# Patient Record
Sex: Male | Born: 2000 | Race: White | Hispanic: No | Marital: Single | Smoking: Never smoker
Health system: Southern US, Community
[De-identification: ages and names within clinical notes are randomized; demographics above are authoritative.]

## PROBLEM LIST (undated history)

## (undated) DIAGNOSIS — F909 Attention-deficit hyperactivity disorder, unspecified type: Secondary | ICD-10-CM

## (undated) DIAGNOSIS — Z789 Other specified health status: Secondary | ICD-10-CM

---
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
## 2020-02-24 ENCOUNTER — Emergency Department (HOSPITAL_COMMUNITY): Payer: Medicaid Other

## 2020-02-24 ENCOUNTER — Other Ambulatory Visit: Payer: Self-pay

## 2020-02-24 ENCOUNTER — Encounter (HOSPITAL_COMMUNITY): Payer: Self-pay | Admitting: Emergency Medicine

## 2020-02-24 ENCOUNTER — Emergency Department (HOSPITAL_COMMUNITY)
Admission: EM | Admit: 2020-02-24 | Discharge: 2020-02-25 | Disposition: A | Discharge status: Home / Self Care | Payer: Medicaid Other | Attending: Emergency Medicine | Admitting: Emergency Medicine

## 2020-02-24 DIAGNOSIS — R059 Cough, unspecified: Secondary | ICD-10-CM | POA: Diagnosis present

## 2020-02-24 DIAGNOSIS — J069 Acute upper respiratory infection, unspecified: Secondary | ICD-10-CM | POA: Diagnosis not present

## 2020-02-24 DIAGNOSIS — U071 COVID-19: Secondary | ICD-10-CM | POA: Diagnosis not present

## 2020-02-24 DIAGNOSIS — Z20822 Contact with and (suspected) exposure to covid-19: Secondary | ICD-10-CM

## 2020-02-24 NOTE — ED Triage Notes (Signed)
Patient reports generalized body aches with chills , dry cough , headache and sore throat this week , unvaccinated against Covid19 .

## 2020-02-25 LAB — SARS CORONAVIRUS 2 (TAT 6-24 HRS): SARS Coronavirus 2: POSITIVE — AB

## 2020-02-25 NOTE — Discharge Instructions (Signed)
Isolate as if your COVID test is positive until you have received test results.   Return to the ED if you have shortness of breath, high fever, severe pain.

## 2020-02-25 NOTE — ED Provider Notes (Signed)
[facility] EMERGENCY DEPARTMENT Provider Note   CSN: [id number] Arrival date & time: 02/24/20  1919     History Chief Complaint  Patient presents with  . BodyAches/Chills/Cough    Unvaccinated    Alejandro Wood is a 20 y.o. male.  Patient to ED with symptoms of body aches, dry cough, headache and sore throat for the past week. Reports known contact with COVID positive person. He is unvaccinated. No SOB, vomiting, high fever. He is eating and drinking ok. No other health issues.   The history is provided by the patient. No language interpreter was used.       Past Medical History:  Diagnosis Date  . Attention deficit disorder (ADD), child, with hyperactivity   . Medical history non-contributory     Patient Active Problem List   Diagnosis Date Noted  . OCD (obsessive compulsive disorder) 11/13/2018  . Hallucinations 11/12/2018  . Learning disability 10/26/2015  . Groin strain 05/13/2013  . ADD (attention deficit disorder) 06/19/2012    History reviewed. No pertinent surgical history.     Family History  Problem Relation Age of Onset  . Asthma Other   . Cancer Other   . Diabetes Other   . Healthy Mother   . Healthy Father     Social History   Tobacco Use  . Smoking status: Never Smoker  . Smokeless tobacco: Never Used  Vaping Use  . Vaping Use: Every day  Substance Use Topics  . Alcohol use: No  . Drug use: No    Home Medications Prior to Admission medications   Medication Sig Start Date End Date Taking? Authorizing Provider  benztropine (COGENTIN) 0.5 MG tablet Take 1 tablet (0.5 mg total) by mouth 2 (two) times daily. 11/16/18   Leata Mouse, MD  diphenhydrAMINE (BENADRYL) 50 MG capsule Take 1 capsule (50 mg total) by mouth every 8 (eight) hours as needed for sleep (Anxiety and agitation). 11/15/18   Leata Mouse, MD  FLUoxetine (PROZAC) 20 MG capsule Take 1 capsule (20 mg total) by mouth daily. 11/16/18    Leata Mouse, MD  ondansetron (ZOFRAN ODT) 4 MG disintegrating tablet Take 1 tablet (4 mg total) by mouth every 8 (eight) hours as needed for nausea or vomiting. 08/29/19   Lamptey, Britta Mccreedy, MD  risperiDONE (RISPERDAL) 1 MG tablet Take 1 tablet (1 mg total) by mouth 2 (two) times daily. 11/16/18   Leata Mouse, MD  risperiDONE (RISPERDAL) 2 MG tablet Take by mouth. 07/13/19   [provider]  traZODone (DESYREL) 50 MG tablet TAKE 1 TABLET BY ORAL ROUTE PER AT BEDTIME 2ND LINE OPTION TO TAKE AS NEEDED FOR SLEEP 07/13/19   [provider]    Allergies    Patient has no known allergies.  Review of Systems   Review of Systems  Constitutional: Negative for chills and fever.  HENT: Positive for congestion.   Respiratory: Positive for cough. Negative for shortness of breath.   Cardiovascular: Negative.  Negative for chest pain.  Gastrointestinal: Negative.  Negative for nausea.  Genitourinary: Negative.   Musculoskeletal: Positive for myalgias.  Skin: Negative.   Neurological: Positive for headaches. Negative for syncope and weakness.    Physical Exam Updated Vital Signs BP 114/70 (BP Location: Right Arm)   Pulse 66   Temp 98.3 F (36.8 C) (Oral)   Resp 18   Ht 5\' 9"  (1.753 m)   Wt 62 kg   SpO2 99%   BMI 20.18 kg/m   Physical  Exam Vitals and nursing note reviewed.  Constitutional:      Appearance: He is well-developed and well-nourished.  HENT:     Head: Normocephalic.     Nose: Nose normal.     Mouth/Throat:     Mouth: Mucous membranes are moist.  Cardiovascular:     Rate and Rhythm: Normal rate and regular rhythm.     Heart sounds: No murmur heard.   Pulmonary:     Effort: Pulmonary effort is normal.     Breath sounds: Normal breath sounds. No wheezing, rhonchi or rales.  Abdominal:     General: Bowel sounds are normal.     Palpations: Abdomen is soft.     Tenderness: There is no abdominal tenderness. There is no guarding or  rebound.  Musculoskeletal:        General: Normal range of motion.     Cervical back: Normal range of motion and neck supple.  Skin:    General: Skin is warm and dry.     Findings: No rash.  Neurological:     Mental Status: He is alert and oriented to person, place, and time.  Psychiatric:        Mood and Affect: Mood and affect normal.     ED Results / Procedures / Treatments   Labs (all labs ordered are listed, but only abnormal results are displayed) Labs Reviewed  SARS CORONAVIRUS 2 (TAT 6-24 HRS)    EKG None  Radiology DG Chest Portable 1 View  Result Date: 02/24/2020 CLINICAL DATA:  Cough, chills, body aches EXAM: PORTABLE CHEST 1 VIEW COMPARISON:  None. FINDINGS: The heart size and mediastinal contours are within normal limits. Both lungs are clear. The visualized skeletal structures are unremarkable. IMPRESSION: Negative. Electronically Signed   By: Charlett Nose M.D.   On: 02/24/2020 21:52    Procedures Procedures (including critical care time)  Medications Ordered in ED Medications - No data to display  ED Course  I have reviewed the triage vital signs and the nursing notes.  Pertinent labs & imaging results that were available during my care of the patient were reviewed by me and considered in my medical decision making (see chart for details).    MDM Rules/Calculators/A&P                          Patient to ED with ss/sxs as per HPI.   He is very well appearing. VSS, no fever, hypoxia or tachycardia. COVID is pending but presumed positive with current symptoms and known direct contact. CXR clear.   He can go home with instructions on isolation. He reports he is familiar with MYchart to access test results. Return precautions discussed.   Final Clinical Impression(s) / ED Diagnoses Final diagnoses:  None   1. Viral URI 2. COVID exposure.  Rx / DC Orders ED Discharge Orders    None       Elpidio Anis, Cordelia Poche 02/25/20 0041    Dione Booze,  MD 02/25/20 [phone]

## 2020-03-28 ENCOUNTER — Encounter (HOSPITAL_COMMUNITY): Payer: Self-pay

## 2020-03-28 ENCOUNTER — Emergency Department (HOSPITAL_COMMUNITY)
Admission: EM | Admit: 2020-03-28 | Discharge: 2020-03-30 | Disposition: A | Discharge status: Home / Self Care | Payer: Medicaid Other | Attending: Emergency Medicine | Admitting: Emergency Medicine

## 2020-03-28 DIAGNOSIS — Z046 Encounter for general psychiatric examination, requested by authority: Secondary | ICD-10-CM | POA: Insufficient documentation

## 2020-03-28 DIAGNOSIS — F429 Obsessive-compulsive disorder, unspecified: Secondary | ICD-10-CM | POA: Insufficient documentation

## 2020-03-28 DIAGNOSIS — R440 Auditory hallucinations: Secondary | ICD-10-CM | POA: Diagnosis not present

## 2020-03-28 DIAGNOSIS — F909 Attention-deficit hyperactivity disorder, unspecified type: Secondary | ICD-10-CM | POA: Insufficient documentation

## 2020-03-28 DIAGNOSIS — F32A Depression, unspecified: Secondary | ICD-10-CM | POA: Diagnosis present

## 2020-03-28 DIAGNOSIS — Z79899 Other long term (current) drug therapy: Secondary | ICD-10-CM | POA: Insufficient documentation

## 2020-03-28 DIAGNOSIS — R45851 Suicidal ideations: Secondary | ICD-10-CM | POA: Insufficient documentation

## 2020-03-28 DIAGNOSIS — U071 COVID-19: Secondary | ICD-10-CM | POA: Diagnosis not present

## 2020-03-28 DIAGNOSIS — F840 Autistic disorder: Secondary | ICD-10-CM | POA: Diagnosis present

## 2020-03-28 DIAGNOSIS — F6381 Intermittent explosive disorder: Secondary | ICD-10-CM | POA: Diagnosis present

## 2020-03-28 LAB — CBC
HCT: 46 % (ref 39.0–52.0)
Hemoglobin: 16.2 g/dL (ref 13.0–17.0)
MCH: 31.8 pg (ref 26.0–34.0)
MCHC: 35.2 g/dL (ref 30.0–36.0)
MCV: 90.2 fL (ref 80.0–100.0)
Platelets: 240 10*3/uL (ref 150–400)
RBC: 5.1 MIL/uL (ref 4.22–5.81)
RDW: 12.3 % (ref 11.5–15.5)
WBC: 8.2 10*3/uL (ref 4.0–10.5)
nRBC: 0 % (ref 0.0–0.2)

## 2020-03-28 LAB — COMPREHENSIVE METABOLIC PANEL
ALT: 17 U/L (ref 0–44)
AST: 21 U/L (ref 15–41)
Albumin: 4.8 g/dL (ref 3.5–5.0)
Alkaline Phosphatase: 66 U/L (ref 38–126)
Anion gap: 12 (ref 5–15)
BUN: 6 mg/dL (ref 6–20)
CO2: 24 mmol/L (ref 22–32)
Calcium: 9.4 mg/dL (ref 8.9–10.3)
Chloride: 102 mmol/L (ref 98–111)
Creatinine, Ser: 1.08 mg/dL (ref 0.61–1.24)
GFR, Estimated: >60 mL/min (ref >60)
Glucose, Bld: 88 mg/dL (ref 70–99)
Potassium: 3.4 mmol/L — ABNORMAL LOW (ref 3.5–5.1)
Sodium: 138 mmol/L (ref 135–145)
Total Bilirubin: 1.3 mg/dL — ABNORMAL HIGH (ref 0.3–1.2)
Total Protein: 7 g/dL (ref 6.5–8.1)

## 2020-03-28 LAB — RAPID URINE DRUG SCREEN, HOSP PERFORMED
Amphetamines: NOT DETECTED
Barbiturates: NOT DETECTED
Benzodiazepines: NOT DETECTED
Cocaine: NOT DETECTED
Opiates: NOT DETECTED
Tetrahydrocannabinol: NOT DETECTED

## 2020-03-28 NOTE — ED Provider Notes (Signed)
[facility] EMERGENCY DEPARTMENT Provider Note   CSN: [id number] Arrival date & time: 03/28/20  2232     History Chief Complaint  Patient presents with  . IVC    Alejandro Wood is a 20 y.o. male presenting under IVC for auditory and visual hallucinations and suicidal thoughts.  Level 5 caveat due to psychiatric disorder.  Per patient, he is not sick and he does not need to be here.  He denies SI, HI, AVH.  He reports no medical problems, states he takes no medications daily.  He states he has never been on medicines.  He denies holding a knife to himself at any point.  Additional history obtained from [employer].  They state that mom reported patient has been saying he wants to kill himself.  He recently had a relationship and.  Per [employer], mom states patient had a knife yesterday that they were able to take away from him, however he was reporting to cut himself.  Per IVC paperwork, patient is having visual hallucinations.  Additionally, he has threatened to kill his mom if she commits him.  Per [employer], mom reports patient has been on medication before, but he does not take it because it makes him "dumb."  Per mom, patient has a history of schizophrenia.  Additional history obtained from chart review.  Per chart review, patient has previously been prescribed medication including Risperdal, Prozac, Cogentin. Last visit in our system was 10/2018.   HPI     Past Medical History:  Diagnosis Date  . Attention deficit disorder (ADD), child, with hyperactivity   . Medical history non-contributory     Patient Active Problem List   Diagnosis Date Noted  . OCD (obsessive compulsive disorder) 11/13/2018  . Hallucinations 11/12/2018  . Learning disability 10/26/2015  . Groin strain 05/13/2013  . ADD (attention deficit disorder) 06/19/2012    History reviewed. No pertinent surgical history.     Family History  Problem Relation Age of Onset  . Asthma Other   . Cancer Other    . Diabetes Other   . Healthy Mother   . Healthy Father     Social History   Tobacco Use  . Smoking status: Never Smoker  . Smokeless tobacco: Never Used  Vaping Use  . Vaping Use: Every day  Substance Use Topics  . Alcohol use: No  . Drug use: No    Home Medications Prior to Admission medications   Medication Sig Start Date End Date Taking? Authorizing Provider  benztropine (COGENTIN) 0.5 MG tablet Take 1 tablet (0.5 mg total) by mouth 2 (two) times daily. 11/16/18   Leata Mouse, MD  diphenhydrAMINE (BENADRYL) 50 MG capsule Take 1 capsule (50 mg total) by mouth every 8 (eight) hours as needed for sleep (Anxiety and agitation). 11/15/18   Leata Mouse, MD  FLUoxetine (PROZAC) 20 MG capsule Take 1 capsule (20 mg total) by mouth daily. 11/16/18   Leata Mouse, MD  ondansetron (ZOFRAN ODT) 4 MG disintegrating tablet Take 1 tablet (4 mg total) by mouth every 8 (eight) hours as needed for nausea or vomiting. 08/29/19   Lamptey, Britta Mccreedy, MD  risperiDONE (RISPERDAL) 1 MG tablet Take 1 tablet (1 mg total) by mouth 2 (two) times daily. 11/16/18   Leata Mouse, MD  risperiDONE (RISPERDAL) 2 MG tablet Take by mouth. 07/13/19   [provider]  traZODone (DESYREL) 50 MG tablet TAKE 1 TABLET BY ORAL ROUTE PER AT BEDTIME 2ND LINE OPTION TO TAKE AS  NEEDED FOR SLEEP 07/13/19   [provider]    Allergies    Patient has no known allergies.  Review of Systems   Review of Systems  Unable to perform ROS: Psychiatric disorder    Physical Exam Updated Vital Signs BP 123/75 (BP Location: Right Arm)   Pulse 89   Temp 98.7 F (37.1 C) (Oral)   Resp 16   SpO2 98%   Physical Exam Vitals and nursing note reviewed.  Constitutional:      General: He is not in acute distress.    Appearance: He is well-developed and well-nourished.     Comments: Appears nontoxic  HENT:     Head: Normocephalic and atraumatic.  Eyes:     Extraocular  Movements: EOM normal.  Pulmonary:     Effort: Pulmonary effort is normal.  Abdominal:     General: There is no distension.  Musculoskeletal:        General: Normal range of motion.     Cervical back: Normal range of motion.  Skin:    General: Skin is warm.     Findings: No rash.  Neurological:     Mental Status: He is alert and oriented to person, place, and time.  Psychiatric:        Mood and Affect: Mood and affect normal. Affect is flat.        Behavior: Behavior is withdrawn.     Comments: Flat affect.  Withdrawn behavior. Poor eye contact.  Denying SI, HI, AVH, however per paperwork, patient has been reporting that he wanted to kill himself, held a knife up to himself, and has threatened to kill his mom.  Additionally, per mom patient has been having hallucinations.     ED Results / Procedures / Treatments   Labs (all labs ordered are listed, but only abnormal results are displayed) Labs Reviewed  RESP PANEL BY RT-PCR (FLU A&B, COVID) ARPGX2  COMPREHENSIVE METABOLIC PANEL  ETHANOL  SALICYLATE LEVEL  ACETAMINOPHEN LEVEL  CBC  RAPID URINE DRUG SCREEN, HOSP PERFORMED    EKG None  Radiology No results found.  Procedures Procedures   Medications Ordered in ED Medications - No data to display  ED Course  I have reviewed the triage vital signs and the nursing notes.  Pertinent labs & imaging results that were available during my care of the patient were reviewed by me and considered in my medical decision making (see chart for details).    MDM Rules/Calculators/A&P                          Patient presenting for psychiatric evaluation.  On exam, patient appears nontoxic.  He has flat affect with withdrawn behavior.  Patient denying SI, HI, AVH, however per mom/[employer]/IVC paperwork, patient has had suicidal statements and behaviors in the past few days.  He is not currently on any medication.  First exam completed by myself.  Patient is medically cleared for  psychiatric evaluation at this time.  The patient has been placed in psychiatric observation due to the need to provide a safe environment for the patient while obtaining psychiatric consultation and evaluation, as well as ongoing medical and medication management to treat the patient's condition.  The patient has been placed under full IVC at this time.  Final Clinical Impression(s) / ED Diagnoses Final diagnoses:  None    Rx / DC Orders ED Discharge Orders    None  Alveria Apley, PA-C 03/28/20 2323    Sabas Sous, MD 03/29/20 [phone]

## 2020-03-28 NOTE — ED Triage Notes (Signed)
Pt comes via [employer] with IVC paperwork, according to paperwork pt had a knife and stated he wanted to kill himself, pt denies SI/HI/AVH

## 2020-03-28 NOTE — BH Assessment (Incomplete)
Mother reports that patient did grab a knife last night.  He says "I want to die, I don't have anything to live for."  Patient will tell family that he wants to walk the streets.  Mother says that he last night was saying "get out of my head, I can't stand this."  She said that he is supposed to be on medications but he will not take it.  He says the medication makes things worse.  Mother said that he has been on respiridone.    Mother said that patient has autism but is high functioning.  He went through high school and got his regular diploma. Patient does not have a guardian.

## 2020-03-29 DIAGNOSIS — R45851 Suicidal ideations: Secondary | ICD-10-CM

## 2020-03-29 DIAGNOSIS — F840 Autistic disorder: Secondary | ICD-10-CM | POA: Diagnosis present

## 2020-03-29 DIAGNOSIS — F32A Depression, unspecified: Secondary | ICD-10-CM | POA: Diagnosis present

## 2020-03-29 DIAGNOSIS — F6381 Intermittent explosive disorder: Secondary | ICD-10-CM | POA: Diagnosis present

## 2020-03-29 LAB — RESP PANEL BY RT-PCR (FLU A&B, COVID) ARPGX2
Influenza A by PCR: NEGATIVE
Influenza B by PCR: NEGATIVE
SARS Coronavirus 2 by RT PCR: POSITIVE — AB

## 2020-03-29 LAB — ETHANOL: Alcohol, Ethyl (B): <10 mg/dL (ref <10)

## 2020-03-29 LAB — ACETAMINOPHEN LEVEL: Acetaminophen (Tylenol), Serum: <10 ug/mL — ABNORMAL LOW (ref 10–30)

## 2020-03-29 LAB — SALICYLATE LEVEL: Salicylate Lvl: <7 mg/dL — ABNORMAL LOW (ref 7.0–30.0)

## 2020-03-29 MED ORDER — ZIPRASIDONE MESYLATE 20 MG IM SOLR: 10.0000 mg | INTRAMUSCULAR | Status: DC | PRN

## 2020-03-29 MED ORDER — RISPERIDONE 1 MG PO TBDP
1.0000 mg | ORAL_TABLET | Freq: Every day | ORAL | Status: DC
  Administered 2020-03-30: 1 mg via ORAL
  Filled 2020-03-29: qty 1

## 2020-03-29 MED ORDER — BENZTROPINE MESYLATE 1 MG PO TABS
0.5000 mg | ORAL_TABLET | Freq: Two times a day (BID) | ORAL | Status: DC
  Administered 2020-03-29: 0.5 mg via ORAL
  Filled 2020-03-29: qty 1

## 2020-03-29 MED ORDER — RISPERIDONE 2 MG PO TABS
2.0000 mg | ORAL_TABLET | Freq: Every day | ORAL | Status: DC
  Administered 2020-03-29: 2 mg via ORAL
  Filled 2020-03-29 (×2): qty 1

## 2020-03-29 MED ORDER — DIPHENHYDRAMINE HCL 25 MG PO CAPS: 50.0000 mg | ORAL_CAPSULE | Freq: Three times a day (TID) | ORAL | Status: DC | PRN

## 2020-03-29 MED ORDER — FLUOXETINE HCL 20 MG PO CAPS
20.0000 mg | ORAL_CAPSULE | Freq: Every day | ORAL | Status: DC
  Administered 2020-03-29 – 2020-03-30 (×2): 20 mg via ORAL
  Filled 2020-03-29 (×2): qty 1

## 2020-03-29 NOTE — BH Assessment (Signed)
Comprehensive Clinical Assessment (CCA) Note  03/29/2020 Alejandro Wood [id number] -Clinician reviewed note from Alveria Apley, PA.  Alejandro Wood is a 20 y.o. male presenting under IVC for auditory and visual hallucinations and suicidal thoughts. Per patient, he is not sick and he does not need to be here.  He denies SI, HI, AVH.  He reports no medical problems, states he takes no medications daily.  He states he has never been on medicines.  He denies holding a knife to himself at any point. Additional history obtained from [employer].  They state that mom reported patient has been saying he wants to kill himself.  He recently had a relationship and.  Per [employer], mom states patient had a knife yesterday that they were able to take away from him, however he was reporting to cut himself.  Per IVC paperwork, patient is having visual hallucinations.  Additionally, he has threatened to kill his mom if she commits him.  Per [employer], mom reports patient has been on medication before, but he does not take it because it makes him "dumb."  Per mom, patient has a history of schizophrenia.  Patient says he does not know why he is at the hospital.  Clinician asked if he felt suicidal or if he had taken a knife yesterday (02/11) to harm himself.  He says "No, I don't know what you are talking about."  Pt denies any threats to harm his mother if she had him committed.  He says "I don't know what you are talking about."    Patient denies any A/V hallucinations.  Denies use of ETOH or A/V hallucinations.  He responds "no" to questions about depression or having recently lost a relationship.  Patient has fair eye contact and is restless.  He taps his foot and appears impatient with assessment.  He does not appear to be responding to internal stimuli at this time.  When asked about previous hospitalization he says "I was depressed but I am better now.  I don't need anything."  Pt does not appear to be having delusional thought  processes.  He reports appetite and sleep to be WNL.  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   Mother reports that patient did grab a knife last night.  He says "I want to die, I don't have anything to live for."  Patient will tell family that he wants to walk the streets.  Mother says that he last night was saying "get out of my head, I can't stand this."  She said that he is supposed to be on medications but he will not take it.  He says the medication makes things worse.  Mother said that he has been on respiridone.    Mother said that patient has autism but is high functioning.  He went through high school and got his regular diploma. Patient does not have a guardian.   Mother is concerned about transportation if patient were to go away to another hospital.  -Clinician spoke with Melbourne Abts, PA who recommends patient be observed in the ED overnight for safety.  Clinician IM'ed Alejandro Honour, RN of disposition.  Chief Complaint:  Chief Complaint  Patient presents with  . IVC   Visit Diagnosis: ADD, OCD   CCA Screening, Triage and Referral (STR)  Patient Reported Information How did you hear about [place]? Legal System (Pt is on IVC.)  Referral name: No data recorded Referral phone number: No data recorded  Whom do you see for routine medical problems? Primary Care  Practice/Facility Name: [facility] medicine  Practice/Facility Phone Number: No data recorded Name of Contact: Dr. Lynnae Sandhoff Number: No data recorded Contact Fax Number: No data recorded Prescriber Name: No data recorded Prescriber Address (if known): No data recorded  What Is the Reason for Your Visit/Call Today? Patient is on IVC.  Petitioner said in IVC that patient has had a relationship to end recently.  Yesterday they had to take a knife away from him.  Today, according to IVC, he has threatened to kill mother if she committed him.  According to IVC, patient has threatened  to cut himself.  Patient says that this did not happen, "I don't know what you are talking about."  He says this often as a response to questions.  Pt denies any SI, HI or A/V hallucinations.  How Long Has This Been Causing You Problems? <Week  What Do You Feel Would Help You the Most Today? Other (Comment) (Pt does not know why he is at the hospital.)   Have You Recently Been in Any Inpatient Treatment (Hospital/Detox/Crisis Center/28-Day Program)? No  Name/Location of Program/Hospital:No data recorded How Long Were You There? No data recorded When Were You Discharged? No data recorded  Have You Ever Received Services From [facility] Before? Yes  Who Do You See at [facility]? Pt was on the [facility] C/A unit in 10/2018   Have You Recently Had Any Thoughts About Hurting Yourself? No (Pt says he did not want to hurt himself.  IVC papers say he had threatened to to harm himself.)  Are You Planning to Commit Suicide/Harm Yourself At This time? No (Pt denies SI.)   Have you Recently Had Thoughts About Hurting Someone Alejandro Wood? No (Reportedly has threatened to kill mother if she committed him.)  Explanation: No data recorded  Have You Used Any Alcohol or Drugs in the Past 24 Hours? No  How Long Ago Did You Use Drugs or Alcohol? No data recorded What Did You Use and How Much? No data recorded  Do You Currently Have a Therapist/Psychiatrist? Yes  Name of Therapist/Psychiatrist: Dr. Criss Alvine with [facility] does psychiatric meds.   Have You Been Recently Discharged From Any Office Practice or Programs? No  Explanation of Discharge From Practice/Program: No data recorded    CCA Screening Triage Referral Assessment Type of Contact: Tele-Assessment  Is this Initial or Reassessment? Initial Assessment  Date Telepsych consult ordered in [facility]:  03/28/2020  Time Telepsych consult ordered in [facility]:  2319   Patient Reported Information Reviewed? Yes  Patient Left Without Being Seen? No data  recorded Reason for Not Completing Assessment: No data recorded  Collateral Involvement: Jesua Tamblyn (mother) [phone]   Does Patient Have a Court Appointed Legal Guardian? No data recorded Name and Contact of Legal Guardian: No data recorded If Minor and Not Living with Parent(s), Who has Custody? No data recorded Is CPS involved or ever been involved? Never  Is APS involved or ever been involved? Never   Patient Determined To Be At Risk for Harm To Self or Others Based on Review of Patient Reported Information or Presenting Complaint? No  Method: No data recorded Availability of Means: No data recorded Intent: No data recorded Notification Required: No data recorded Additional Information for Danger to Others Potential: No data recorded Additional Comments for Danger to Others Potential: No data recorded Are There Guns or Other Weapons in Your Home? No data recorded  Types of Guns/Weapons: No data recorded Are These Weapons Safely Secured?                            No data recorded Who Could Verify You Are Able To Have These Secured: No data recorded Do You Have any Outstanding Charges, Pending Court Dates, Parole/Probation? No data recorded Contacted To Inform of Risk of Harm To Self or Others: Other: Comment (Mother is aware of threat to her safety.)   Location of Assessment: [facility] ED   Does Patient Present under Involuntary Commitment? Yes  IVC Papers Initial File Date: 03/28/2020   [place] of Residence: [place]   Patient Currently Receiving the Following Services: Medication Management   Determination of Need: Emergent (2 hours)   Options For Referral: Therapeutic Triage Services (Pt is to be observed overnight and be seen by psychiatry in AM.)     CCA Biopsychosocial Intake/Chief Complaint:  Patient arrived at [facility] on IVC initiated by family member.  According to IVC papers patient has recently been in an relationship that ended.  He has made threats  to kill himself and yesterday a knife had to be taken from him.  Patient reportedly had threatened to kill his mother if she took out papers on him.  Patint reportedly has been cutting.  Patient denies all of this.  He denies SI, HI or A/V hallucinations.  He responds "I don't know what you are talking about" often.  Current Symptoms/Problems: Pt is denying any SI, HI or A/V hallucinations.  Mother says that patient however will say "get out of my head, I can't stand this."  as if talking to someone not there.  She said he has left the house and wandered teh streets and she said this is dangerous in their neighborhood.  Mother said that patient has medication that he is supposed to be taking but he refused to take it.  It is prescribed by a Dr. Criss Alvine with [facility] she said.   Patient Reported Schizophrenia/Schizoaffective Diagnosis in Past: -- (Per mother he has "borderline schizophrenia.")   Strengths: No data recorded Preferences: No data recorded Abilities: No data recorded  Type of Services Patient Feels are Needed: No data recorded  Initial Clinical Notes/Concerns: No data recorded  Mental Health Symptoms Depression:  None (Patient denies.)   Duration of Depressive symptoms: No data recorded  Mania:  None (Pt denies.)   Anxiety:   None (Pt denies.)   Psychosis:  Hallucinations (Pt denies but IVC says otherwise.)   Duration of Psychotic symptoms: No data recorded  Trauma:  None   Obsessions:  None   Compulsions:  None   Inattention:  None   Hyperactivity/Impulsivity:  N/A   Oppositional/Defiant Behaviors:  Argumentative (Per mother.)   Emotional Irregularity:  No data recorded  Other Mood/Personality Symptoms:  No data recorded   Mental Status Exam Appearance and self-care  Stature:  Average   Weight:  Average weight   Clothing:  No data recorded  Grooming:  Well-groomed   Cosmetic use:  No data recorded  Posture/gait:  No data recorded  Motor activity:   Agitated; Restless   Sensorium  Attention:  Vigilant   Concentration:  Normal   Orientation:  Time   Recall/memory:  Defective in Recent   Affect and Mood  Affect:  Congruent   Mood:  Anxious   Relating  Eye contact:  Normal   Facial expression:  Tense   Attitude toward examiner:  Defensive   Thought and Language  Speech flow: Clear and Coherent   Thought content:  Appropriate to Mood and Circumstances   Preoccupation:  None   Hallucinations:  -- (Mother said he is responding to internal stimuli.)   Organization:  No data recorded  Aff[employer] Computer ServicesExecutive Functions  Fund of Knowledge:  No data recorded  Intelligence:  No data recorded  Abstraction:  No data recorded  Judgement:  No data recorded  Reality Testing:  No data recorded  Insight:  No data recorded  Decision Making:  No data recorded  Social Functioning  Social Maturity:  Impulsive   Social Judgement:  Naive   Stress  Stressors:  No data recorded  Coping Ability:  No data recorded  Skill Deficits:  Decision making   Supports:  Family     Religion:    Leisure/Recreation:    Exercise/Diet: Exercise/Diet Do You Follow a Special Diet?: No Do You Have Any Trouble Sleeping?: No   CCA Employment/Education Employment/Work Situation: Employment / Work Situation Employment situation: Employed 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 This is the only job he has had. He states he has worked with his parents for a long time. 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 Has patient ever been in the [employer] and Companymilitary?: No  Education: Education Did Garment/textile technologistYou Graduate From [employer] School?: Yes Did Theme park managerYou Attend College?: No   CCA Family/Childhood History Family and Relationship History: Family history Marital  status: Single  Childhood History:  Childhood History Does patient have siblings?: Yes Number of Siblings: 4 Did patient suffer any verbal/emotional/physical/sexual abuse as a child?: No Did patient suffer from severe childhood neglect?: No Has patient ever been sexually abused/assaulted/raped as an adolescent or adult?: No Was the patient ever a victim of a crime or a disaster?: No Witnessed domestic violence?: No Has patient been affected by domestic violence as an adult?: No  Child/Adolescent Assessment:     CCA Substance Use Alcohol/Drug Use: Alcohol / Drug Use Pain Medications: None Prescriptions: Pt is suposed to be taking certraline HCL, Trazadone, respiridone, beztropine. Over the Counter: Viatramins History of alcohol / drug use?: No history of alcohol / drug abuse                         ASAM's:  Six Dimensions of Multidimensional Assessment  Dimension 1:  Acute Intoxication and/or Withdrawal Potential:      Dimension 2:  Biomedical Conditions and Complications:      Dimension 3:  Emotional, Behavioral, or Cognitive Conditions and Complications:     Dimension 4:  Readiness to Change:     Dimension 5:  Relapse, Continued use, or Continued Problem Potential:     Dimension 6:  Recovery/Living Environment:     ASAM Severity Score:    ASAM Recommended Level of Treatment:     Substance use Disorder (SUD)    Recommendations for Services/Supports/Treatments:    DSM5 Diagnoses: Patient Active Problem List   Diagnosis Date Noted  . OCD (obsessive compulsive disorder) 11/13/2018  . Hallucinations 11/12/2018  . Learning disability 10/26/2015  . Groin strain 05/13/2013  . ADD (attention deficit disorder) 06/19/2012    Patient Centered Plan: Patient is on the following Treatment Plan(s):  Anxiety   Referrals to Alternative Service(s): Referred  to Alternative Service(s):   Place:   Date:   Time:    Referred to Alternative Service(s):   Place:   Date:    Time:    Referred to Alternative Service(s):   Place:   Date:   Time:    Referred to Alternative Service(s):   Place:   Date:   Time:     Wandra Mannan

## 2020-03-29 NOTE — Consult Note (Signed)
  Provider attempted to complete assessment. Machine continues not to work properly. Charge RN [employer] notified. Will re-attempt shortly.

## 2020-03-29 NOTE — ED Provider Notes (Signed)
Emergency Medicine Observation Re-evaluation Note  Alejandro Wood is a 20 y.o. male, seen on rounds today.  Pt initially presented to the ED for complaints of IVC Currently, the patient is anxious.  Physical Exam  BP 116/64 (BP Location: Right Arm)   Pulse 87   Temp 98.6 F (37 C) (Oral)   Resp 14   SpO2 97%  Physical Exam General: no acute distress Lungs: Normal work of breathing Neuro: Ambulatory without difficulty Psych: Anxious  ED Course / MDM  EKG:    I have reviewed the labs performed to date as well as medications administered while in observation.  Recent changes in the last 24 hours include intermittent anxiety, agitation.  Plan  Current plan is for TTS assessment  Called by nursing to bedside as patient was becoming anxious and aggitated.  Patient is currently under IVC.  My evaluation patient does appear anxious. He however is redirectable.  Does not want any medications at this time. Will write for some PRN meds to help with anxiety should patient need.  Patient does not answer any other questions  Patient is under full IVC at this time.   Reaghan Kawa A, PA-C 03/29/20 1309    Jacalyn Lefevre, MD 03/29/20 1329

## 2020-03-29 NOTE — BH Assessment (Addendum)
PROVIDER TO SEE-Upon review of chart, provider attempted to complete assessment. Machine continues not to work properly. Charge RN [employer] notified. Will re-attempt shortly. Disposition remains pending the provider's assessment.

## 2020-03-29 NOTE — ED Notes (Signed)
Patient given phone call at desk. Patient restless and agitated; stating he does not need to be here, requesting that his belongings be given back to him. This RN explained protocol and why belongings cannot be returned at this time. Britni PA to bedside to assess patient. Patient states he is agitated and irritable but does not want to take any medications. PRN orders to be placed by provider. RN to alert security and administer meds if behaviors escalate.

## 2020-03-29 NOTE — ED Notes (Signed)
TTS in progress 

## 2020-03-29 NOTE — ED Notes (Signed)
Patients mother updated via telephone.  °

## 2020-03-29 NOTE — ED Notes (Signed)
Staffing contacted to determine when sitter would be available for patient. No sitters available at this time. RN informed by staffing that sitter may also not be available for following shift.

## 2020-03-29 NOTE — ED Notes (Signed)
One bag of pt items, put in locker number 1

## 2020-03-29 NOTE — ED Notes (Signed)
Breakfast Ordered 

## 2020-03-29 NOTE — ED Notes (Signed)
Lynelle Doctor, MD spoke with pt to update him on the IVC process and informed pt we are awaiting the TTS evaluation to be documented.

## 2020-03-29 NOTE — ED Notes (Signed)
TTS eval completed.

## 2020-03-29 NOTE — ED Notes (Signed)
Food and drink provided.

## 2020-03-29 NOTE — ED Notes (Signed)
Updated pt on plan of care following results of TTS eval. Pt agrees to take recommended medications. Plan is to discharge pt with family tomorrow morning per Balinda Quails, NP.

## 2020-03-29 NOTE — Consult Note (Signed)
  TTS machine unavailable at this time. Will attempt at a later time.

## 2020-03-29 NOTE — ED Notes (Signed)
Patient's mother updated by Missy RN

## 2020-03-29 NOTE — Consult Note (Signed)
Telepsych Consultation   Reason for Consult:  Psych consult Referring Physician:  Alveria ApleySophia Caccavale, PA-C Location of Patient: [facility] [phone] [facility] Provider[facility]  Patient Identification: Alejandro Wood MRN:  [id number] Principal Diagnosis: Suicidal ideations Diagnosis:  Principal Problem:   Suicidal ideations Active Problems:   Autism spectrum   Intermittent explosive disorder in adult   Depression  Total Time spent with patient: 15 minutes  Subjective:   Alejandro Wood is a 20 y.o. male patient admitted via IVC for auditory and visual hallucinations and suicidal thoughts. Patient has history of Autism Spectrum Disorder. Patient initially presents calm and cooperative. Patient is alert and oriented. When asked reason for being in hospital patient responded, "Umm my mom, I guess. I don't know. I don't know why I'm here honestly. She just thinks I'm going to harm myself or someone else. Honestly if I was going to harm myself or someone else I would have done it already".   Patient admits to making statements about wanting to take my life, states he's unable to state the exact date (about 2 days ago). Says he said it because "I didn't want to live no more I guess. Just tired of life. Sometimes I just feel loved. The people I love that I've lost, grandma, grandpa, I guess. 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 Patient endorses having a knife and states he did have intent on harming himself but changed his mind "because nothing is worth taking your life no matter what it is. If I was going to take my life I would've done it already, I've had plenty of chances". Patient admits threatening his mother stating, "I know she's my mom but I don't like it. She's like a bug. She just nags and nags, it's like she gets a kick out of. She just doesn't understand I want to be left alone sometimes like  she does. I have the thoughts of wanting hurt her but I would never hurt her.   Endorsing "good" sleep, "amazing" energy, denies any feelings of hopelessness/worthlessness. Patient states he "doesn't like to do much"; no girlfriend or significant relationship. States he "loves to go to work come home play the game, and study hard for what I want to do in life". Grandmother passed 01/2020, states they were very close and spoke everyday. Patient endorses extensive history of explosive behavior "all the time. All my life". Patient states he did take medication "a long time ago, but it doesn't help me". Patient denies any alcohol or illicit substance use. States he doesn't understand why he's here, "I don't understand. I'm [age] almost 20 years old, I'm a grown man. I shouldn't have to be somewhere I don't want to be. I don't want no help, I don't need no help. Just let me go home and go to work and let me deal with that".   Patient denies any thoughts of harm himself or others, auditory or visual hallucinations; does appear to be possibly responding to some external/internal stimuli. Patient provided name and number of his mother for collateral information. Gave permission to speak to his mother. At the end of assessment patient began to escalate demanding discharge stating, "I'm not staying here another night. I'm leaving. I hate this place. Y'all are just going to have to do what you have to do".   Collateral: [facility] Aranabitha Wood [phone] "He's borderline schizophrenia, depression, and anxiety. Back  in September 2020, he spent 7 days at [facility] and was sent to [facility]. He has depression and anxiety; and when it builds up he can't handle it. He talks to himself. He paces the floor. I'm not sure if it's voices, I have a sister with schizophrenia that does the same thing. He creates these scenarios in his head and then creates these challenges that he says he has to complete. He was on medicine but says it makes him  drowsy and feels stupid. He fights his sleep because he wants to be active. He just had a med check 2 months and he stopped taking his medication. (Risperadone, Benztropine, Trazodone). Admits patient has been having these explosive episodes all of his life and understands it's presentation in ASD. Endorses patient can become verbally aggressive and wish her dead when upset, but states "But he's not like that all the time, on good days he will let me love on him". Denies any drug use; patient does vape. Mom states patient has never physically attacked anyone in the home, only verbal. Mom states, "I can handle him but the thing this time was he grabbed the knife and walked back to his room. I don't think he did it hurt himself, I think he did it make me upset. Then he could say something and make me cry; then he feels like he wins. The way he was acting had me concerned; I didn't think he was going to hurt himself but bc he was mad and angry he could've ran out in front of a car and gotten hit. He wasn't trying to hurt himself or anybody, he just said he wanted to be alone. That's my son and I didn't want him out here running the streets. He does have autism and I just lost my mom in December but I'm scared hes going to run into the wrong crowd".   Mom states she doesn't feel patient is a threat to others; says she understands his explosive episodes are a part of his diagnosis and feels safe with him returning home. Would like medications adjusted. Patient receives outpatient services via [facility] including medication management, unclear on therapist. Mom is requesting resources for another therapist. States she does not feel patient being in a hospital would be beneficial at this time. Mom states she would like patient restarted on medications and she will get patient in the morning.   HPI:   Alejandro Wood is a 20 year old male admitted via IVC for auditory and visual hallucinations and suicidal ideations.  Patient has history of Autism Spectrum Disorder with intermittent explosive episodes. Patient has past psychiatric history of inpatient admission [facility] 11/09/2018). Patient receives outpatient medication management via [facility]; last appointment "a few weeks ago". Patient lives at home with parents; mom actively involved.   Past Psychiatric History:  Obsessive Compulsive Disorder Attention Deficit Disorder [facility] admission 10/2018  Risk to Self:  pt denies Risk to Others:  pt denies Prior Inpatient Therapy:  yes Prior Outpatient Therapy:  yes  Past Medical History:  Past Medical History:  Diagnosis Date  . Attention deficit disorder (ADD), child, with hyperactivity   . Medical history non-contributory    History reviewed. No pertinent surgical history. Family History:  Family History  Problem Relation Age of Onset  . Asthma Other   . Cancer Other   . Diabetes Other   . Healthy Mother   . Healthy Father    Family Psychiatric  History:  (m) aunt:  schizophrenia  Social History:  Social History   Substance and Sexual Activity  Alcohol Use No     Social History   Substance and Sexual Activity  Drug Use No    Social History   Socioeconomic History  . Marital status: Single    Spouse name: Not on file  . Number of children: Not on file  . Years of education: 24  . Highest education level: 11th grade  Occupational History  . Occupation: Consulting civil engineer  Tobacco Use  . Smoking status: Never Smoker  . Smokeless tobacco: Never Used  Vaping Use  . Vaping Use: Every day  Substance and Sexual Activity  . Alcohol use: No  . Drug use: No  . Sexual activity: Yes    Birth control/protection: None  Other Topics Concern  . Not on file  Social History Narrative   Pt lives with mother, father, three sisters, and one brother in [place].  He is a Consulting civil engineer at [employer].  He does not have any outpatient psychiatric services.   Social Determinants of Health   Financial Resource  Strain: Not on file  Food Insecurity: Not on file  Transportation Needs: Not on file  Physical Activity: Not on file  Stress: Not on file  Social Connections: Not on file   Additional Social History:   Allergies:  No Known Allergies  Labs:  Results for orders placed or performed during the hospital encounter of 03/28/20 (from the past 48 hour(s))  Comprehensive metabolic panel     Status: Abnormal   Collection Time: 03/28/20 10:53 PM  Result Value Ref Range   Sodium 138 135 - 145 mmol/L   Potassium 3.4 (L) 3.5 - 5.1 mmol/L   Chloride 102 98 - 111 mmol/L   CO2 24 22 - 32 mmol/L   Glucose, Bld 88 70 - 99 mg/dL    Comment: Glucose reference range applies only to samples taken after fasting for at least 8 hours.   BUN 6 6 - 20 mg/dL   Creatinine, Ser 2.99 0.61 - 1.24 mg/dL   Calcium 9.4 8.9 - 37.1 mg/dL   Total Protein 7.0 6.5 - 8.1 g/dL   Albumin 4.8 3.5 - 5.0 g/dL   AST 21 15 - 41 U/L   ALT 17 0 - 44 U/L   Alkaline Phosphatase 66 38 - 126 U/L   Total Bilirubin 1.3 (H) 0.3 - 1.2 mg/dL   GFR, Estimated >69 >67 mL/min    Comment: (NOTE) Calculated using the CKD-EPI Creatinine Equation (2021)    Anion gap 12 5 - 15    Comment: Performed at [facility], [address] [place], [place] [address]  cbc     Status: None   Collection Time: 03/28/20 10:53 PM  Result Value Ref Range   WBC 8.2 4.0 - 10.5 K/uL   RBC 5.10 4.22 - 5.81 MIL/uL   Hemoglobin 16.2 13.0 - 17.0 g/dL   HCT 01.7 51.0 - 25.8 %   MCV 90.2 80.0 - 100.0 fL   MCH 31.8 26.0 - 34.0 pg   MCHC 35.2 30.0 - 36.0 g/dL   RDW 52.7 78.2 - 42.3 %   Platelets 240 150 - 400 K/uL   nRBC 0.0 0.0 - 0.2 %    Comment: Performed at [facility], [address], [place], [place] [address]  Ethanol     Status: None   Collection Time: 03/28/20 10:54 PM  Result Value Ref Range   Alcohol, Ethyl (  B) <10 <10 mg/dL    Comment: (NOTE) Lowest detectable limit for serum alcohol is 10 mg/dL.  For medical purposes  only. Performed at [facility], [address] [place], [place] [address]   Salicylate level     Status: Abnormal   Collection Time: 03/28/20 10:54 PM  Result Value Ref Range   Salicylate Lvl <7.0 (L) 7.0 - 30.0 mg/dL    Comment: Performed at [facility], [address], [place], [place] [address]  Acetaminophen level     Status: Abnormal   Collection Time: 03/28/20 10:54 PM  Result Value Ref Range   Acetaminophen (Tylenol), Serum <10 (L) 10 - 30 ug/mL    Comment: (NOTE) Therapeutic concentrations vary significantly. A range of 10-30 ug/mL  may be an effective concentration for many patients. However, some  are best treated at concentrations outside of this range. Acetaminophen concentrations >150 ug/mL at 4 hours after ingestion  and >50 ug/mL at 12 hours after ingestion are often associated with  toxic reactions.  Performed at [facility], [address], [place], [place] [address]   Rapid urine drug screen (hospital performed)     Status: None   Collection Time: 03/28/20 11:00 PM  Result Value Ref Range   Opiates NONE DETECTED NONE DETECTED   Cocaine NONE DETECTED NONE DETECTED   Benzodiazepines NONE DETECTED NONE DETECTED   Amphetamines NONE DETECTED NONE DETECTED   Tetrahydrocannabinol NONE DETECTED NONE DETECTED   Barbiturates NONE DETECTED NONE DETECTED    Comment: (NOTE) DRUG SCREEN FOR MEDICAL PURPOSES ONLY.  IF CONFIRMATION IS NEEDED FOR ANY PURPOSE, NOTIFY LAB WITHIN 5 DAYS.  LOWEST DETECTABLE LIMITS FOR URINE DRUG SCREEN Drug Class                     Cutoff (ng/mL) Amphetamine and metabolites    1000 Barbiturate and metabolites    200 Benzodiazepine                 200 Tricyclics and metabolites     300 Opiates and metabolites        300 Cocaine and metabolites        300 THC                            50 Performed at [facility], [address] [place], [place] [address]   Resp Panel by RT-PCR (Flu A&B, Covid)  Nasopharyngeal Swab     Status: Abnormal   Collection Time: 03/29/20 12:12 AM   Specimen: Nasopharyngeal Swab; Nasopharyngeal(NP) swabs in vial transport medium  Result Value Ref Range   SARS Coronavirus 2 by RT PCR POSITIVE (A) NEGATIVE    Comment: RESULT CALLED TO, READ BACK BY AND VERIFIED WITH: M. FLORES,RN 0139 03/29/2020 T. TYSOR (NOTE) SARS-CoV-2 target nucleic acids are DETECTED.  The SARS-CoV-2 RNA is generally detectable in upper respiratory specimens during the acute phase of infection. Positive results are indicative of the presence of the identified virus, but do not rule out bacterial infection or co-infection with other pathogens not detected by the test. Clinical correlation with patient history and other diagnostic information is necessary to determine patient infection status. The expected result is Negative.  Fact Sheet for Patients: BloggerCourse.com  Fact Sheet for Healthcare Providers: SeriousBroker.it  This test is not yet approved or cleared by the [place] FDA and  has been authorized for detection and/or diagnosis  of SARS-CoV-2 by FDA under an Emergency Use Authorization (EUA).  This EUA will remain in effect (meaning this test ca n be used) for the duration of  the COVID-19 declaration under Section 564(b)(1) of the Act, 21 U.S.C. section 360bbb-3(b)(1), unless the authorization is terminated or revoked sooner.     Influenza A by PCR NEGATIVE NEGATIVE   Influenza B by PCR NEGATIVE NEGATIVE    Comment: (NOTE) The Xpert Xpress SARS-CoV-2/FLU/RSV plus assay is intended as an aid in the diagnosis of influenza from Nasopharyngeal swab specimens and should not be used as a sole basis for treatment. Nasal washings and aspirates are unacceptable for Xpert Xpress SARS-CoV-2/FLU/RSV testing.  Fact Sheet for Patients: BloggerCourse.com  Fact Sheet for Healthcare  Providers: SeriousBroker.it  This test is not yet approved or cleared by the [place] FDA and has been authorized for detection and/or diagnosis of SARS-CoV-2 by FDA under an Emergency Use Authorization (EUA). This EUA will remain in effect (meaning this test can be used) for the duration of the COVID-19 declaration under Section 564(b)(1) of the Act, 21 U.S.C. section 360bbb-3(b)(1), unless the authorization is terminated or revoked.  Performed at [facility], [address], [place], [place] [address]    Medications:  Current Facility-Administered Medications  Medication Dose Route Frequency Provider Last Rate Last Admin  . benztropine (COGENTIN) tablet 0.5 mg  0.5 mg Oral BID Leevy-Johnson, Wirt Hemmerich A, NP      . diphenhydrAMINE (BENADRYL) capsule 50 mg  50 mg Oral Q8H PRN Leevy-Johnson, Aahna Rossa A, NP      . FLUoxetine (PROZAC) capsule 20 mg  20 mg Oral Daily Leevy-Johnson, Marlaya Turck A, NP      . risperiDONE (RISPERDAL M-TABS) disintegrating tablet 1 mg  1 mg Oral Daily Leevy-Johnson, Katti Pelle A, NP      . risperiDONE (RISPERDAL) tablet 2 mg  2 mg Oral QHS Leevy-Johnson, Lexton Hidalgo A, NP      . ziprasidone (GEODON) injection 10 mg  10 mg Intramuscular PRN Henderly, Britni A, PA-C       Current Outpatient Medications  Medication Sig Dispense Refill  . benztropine (COGENTIN) 0.5 MG tablet Take 1 tablet (0.5 mg total) by mouth 2 (two) times daily. (Patient not taking: Reported on 03/29/2020) 60 tablet 0  . diphenhydrAMINE (BENADRYL) 50 MG capsule Take 1 capsule (50 mg total) by mouth every 8 (eight) hours as needed for sleep (Anxiety and agitation). (Patient not taking: Reported on 03/29/2020) 30 capsule 0  . FLUoxetine (PROZAC) 20 MG capsule Take 1 capsule (20 mg total) by mouth daily. (Patient not taking: Reported on 03/29/2020) 30 capsule 0  . ondansetron (ZOFRAN ODT) 4 MG disintegrating tablet Take 1 tablet (4 mg total) by mouth every 8 (eight) hours as needed  for nausea or vomiting. (Patient not taking: Reported on 03/29/2020) 10 tablet 0  . risperiDONE (RISPERDAL) 1 MG tablet Take 1 tablet (1 mg total) by mouth 2 (two) times daily. (Patient not taking: Reported on 03/29/2020) 60 tablet 0   Musculoskeletal: Strength & Muscle Tone: within normal limits Gait & Station: normal Patient leans: N/A  Psychiatric Specialty Exam: Physical Exam Vitals and nursing note reviewed.  Psychiatric:        Mood and Affect: Affect is blunt.        Speech: Speech normal.        Behavior: Behavior is agitated. Behavior is cooperative.        Thought Content: Thought content normal.  Cognition and Memory: Cognition and memory normal.        Judgment: Judgment is impulsive.     Review of Systems  Psychiatric/Behavioral: Positive for agitation, behavioral problems and dysphoric mood.  All other systems reviewed and are negative.   Blood pressure 116/64, pulse 87, temperature 98.6 F (37 C), temperature source Oral, resp. rate 14, SpO2 97 %.There is no height or weight on file to calculate BMI.  General Appearance: Casual  Eye Contact:  Fair  Speech:  Clear and Coherent  Volume:  Normal  Mood:  Irritable  Affect:  Labile  Thought Process:  Goal Directed  Orientation:  Full (Time, Place, and Person)  Thought Content:  Logical  Suicidal Thoughts:  No  Homicidal Thoughts:  No  Memory:  Immediate;   Fair Recent;   Fair Remote;   Fair  Judgement:  Poor  Insight:  Present and Shallow  Psychomotor Activity:  Normal  Concentration:  Concentration: Fair and Attention Span: Fair  Recall:  [employer] of Knowledge:  Fair  Language:  Fair  Akathisia:  NA  Handed:  Right  AIMS (if indicated):     Assets:  Architect Housing Resilience Social Support Vocational/Educational  ADL's:  Intact  Cognition:  WNL  Sleep:      Treatment Plan Summary: Daily contact with patient to assess and evaluate symptoms and  progress in treatment, Medication management and Plan to adjust/restart medications and discharge home to his mother in the morning.  Patient home medications reviewed with Dr Jola Babinski. The following adjustments made:   -Risperidone:  daily,  hs (adjusted, restarted)  -Fluoxetine  daily (restarted)  -Benztropine 0.5mg  BID (restarted  -Diphenhydramine  q8hrs prn sleep, agitation, anxiety (restarted)  Disposition: Overnight observation to be re-evaluated by psychiatry in the morning.   This service was provided via telemedicine using a 2-way, interactive audio and video technology.  Names of all persons participating in this telemedicine service and their role in this encounter. Name: Maxie Barb Role: PMHNP  Name: Nelly Rout Role: Attending Physician  Name: Jacquelin Hawking Role: Patient  Name: Alejandro Aran Role: mom    Loletta Parish, NP 03/29/2020 5:03 PM

## 2020-03-29 NOTE — ED Notes (Signed)
TTS unable to assess patient remotely at this time due to system outage

## 2020-03-30 MED ORDER — RISPERIDONE 1 MG PO TBDP: 1.0000 mg | ORAL_TABLET | Freq: Every day | ORAL | 0 refills | Status: DC

## 2020-03-30 MED ORDER — RISPERIDONE 2 MG PO TABS: 2.0000 mg | ORAL_TABLET | Freq: Every day | ORAL | 0 refills | Status: DC

## 2020-03-30 NOTE — Discharge Instructions (Signed)
Outpatient Resources:  [facility] Outpatient *   (partial hospitalization, individual therapy, and medication management)  [address] [place], [place] [phone] Walk in appointments Monday through Thursday: 8am to 11am  [facility] * (medication management and therapy) [address] [place], [place] [address] [phone]  [facility] (Therapy only)* (takes [place] and most major insurances)  [place]: [address] [facility] [place], [place] Phone: [phone]  [place]: [address], [place], [place] [address] Phone: [phone]  [place]: [address] [place], [place] [address] Phone: [phone]  [facility] (specializes in trauma therapy, substance abuse and mood disorders. Takes patients without insurance for little to no cost out of pocket. Also takes most major insurances) [address], [place], [place] [address] [phone]  [facility] * (therapy and medication management) [address] [place], [place] [address] [phone]  [facility] (Medication Management) [address] [place], [place] [address] [phone]  [facility] * (therapy only) [address] [place], [place] [address] [phone]  [facility] [address] [place] [address] [phone]  [facility] * [address] [place], [place] [address] [phone]  [facility]* (accepts non-insured) [address] [place], [place] [address] [phone] Walk in Monday-Friday 8am-3pm  [facility] [place]* (takes sliding scale and Medicaid as well as other major insurances) [place]: [address], [place] Phone: [phone] [place]: [facility] [address] [place], [place] Phone: [phone] Walk in Monday-Friday 830am-230pm  [facility]* (accepts [employer] non-insured) [address]  [place], [place] [address] [phone] Walk in Monday-Friday 8am-5pm

## 2020-03-30 NOTE — ED Provider Notes (Signed)
Emergency Medicine Observation Re-evaluation Note  ALEKSI BRUMMET is a 20 y.o. male, seen on rounds today.  Pt initially presented to the ED for complaints of IVC Currently, the patient is resting comfortably.  Physical Exam  BP (!) 90/48   Pulse (!) 55   Temp 98 F (36.7 C) (Oral)   Resp 14   SpO2 99%  Physical Exam General: Nontoxic Cardiac: Bradycardic Lungs: No respiratory distress Psych: Cooperative and calm  ED Course / MDM  EKG:    I have reviewed the labs performed to date as well as medications administered while in observation.  Recent changes in the last 24 hours include interactive and goal oriented.  Plan  Current plan is for discharge with outpatient follow-up. Patient is not under full IVC at this time.  I have rescinded his IVC   Mancel Bale, MD 03/30/20 1253

## 2020-03-30 NOTE — Progress Notes (Signed)
Patient seen by me over telepsych and by TTS counselor Murrell Redden in person. Patient is a 20 year old male with history of ASD, who presented to [facility] on [facility]/20 under IVC from his mother for suicidal ideation. On assessment today patient is calm and cooperative. He admits to stopping his medications at home. Patient has been med compliant with home meds restarted in the ED and states understanding that he needs to continue medications after discharge. He denies any SI/HI/AVH and contracts for safety. TTS counselor Theodoro Grist spoke with his mother/IVC petitioner who denies safety concerns for discharge home today. His Risperdal was increased in the ED, and I have e-scribed increased Risperdal dose. He is psych cleared for discharge.

## 2020-03-30 NOTE — ED Notes (Signed)
Returned pt belongings. Pt signed. Also returned pt belongings from security sealed and witnessed by [place] NT. Pt signed for all items. Discharged paperwork discussed with pt mother and pt.

## 2020-03-30 NOTE — BH Assessment (Signed)
This Clinical research associate spoke with patient's mother Alejandro Wood [phone] with patient's permission to gather collateral information. Mother states she has no safety concerns in reference to patient being discharged this date. Mother states patient has never attempted to self harm and reports patient has lived with her "all his life" assisting with ongoing care. Mother states she is his primary caregiver and reports patient at times, becomes non-compliant with medications and does "things that don't make sense" like walking late at night and "stays gone for hours." Patient this date reports he "wants to get back on his medications" and states "he wants to go home." Patient denies any S/I, H/I or AVH. Mother reports she will assist with transportation when patient is discharged and monitor him for medication compliance once he returns home.

## 2020-03-30 NOTE — Progress Notes (Signed)
Pt has been psychiatrically cleared. Outpatient mental health resources placed in pt's discharge summary.    Wells Guiles, MSW, LCSW, LCAS Clinical Social Worker II Disposition CSW [phone]

## 2020-03-30 NOTE — ED Notes (Addendum)
Pt has been calm and cooperative so far.  Very polite. Pt given toothbrush and toothpaste and offered toileting around 8pm, snack given and drinks also.  At this time pt appears to be sleeping.  No sitter available, continue to check on pt

## 2020-03-31 ENCOUNTER — Telehealth: Payer: Self-pay

## 2020-03-31 NOTE — Telephone Encounter (Signed)
Transition Care Management Unsuccessful Follow-up Telephone Call  Date of discharge and from where:  03/30/2020  Attempts:  1st Attempt  Reason for unsuccessful TCM follow-up call:  Unable to reach patient   Patient was seen for IVC Hallucinations and has been placed under observation [facility]

## 2020-04-01 NOTE — Telephone Encounter (Signed)
Transition Care Management Unsuccessful Follow-up Telephone Call  Date of discharge and from where:  03/30/2020 from [facility]  Attempts:  2nd Attempt  Reason for unsuccessful TCM follow-up call:  Unable to reach patient   Spoke to Tabatha (pt mother) and she stated that he can be reached in the evenings.

## 2020-04-01 NOTE — Telephone Encounter (Signed)
Transition Care Management Unsuccessful Follow-up Telephone Call   Called for patient this evening but call was not answered.

## 2020-04-02 NOTE — Telephone Encounter (Signed)
Transition Care Management Unsuccessful Follow-up Telephone Call  Date of discharge and from where:  03/30/2020 from [facility]  Attempts:  3rd Attempt  Reason for unsuccessful TCM follow-up call:  Unable to reach patient

## 2020-04-03 ENCOUNTER — Telehealth: Payer: Self-pay | Admitting: Family Medicine

## 2020-04-03 NOTE — Telephone Encounter (Signed)
I certainly sympathize with the situation.  I definitely understand her desire to help.  At his age-he is now of legal age-he will have to initiate a lot of this.  There is the 24-hour behavioral health assessment urgent care that he can go to at any time to be assessed.  Also they could help arrange for him to do some counseling.  If she feels he is an immediate harm to himself she should call 911 on his behalf.  Otherwise please give her the information regarding the urgent care behavioral health center in [place] for [place] to be seen by thank you

## 2020-04-03 NOTE — Telephone Encounter (Signed)
Mom contacted. Mom states pt was seeing a therapist at [facility] but that therapist has left and pt has not been back. Informed mom to get in touch with [facility] to set up appt with another male therapist (pt prefers male). Mom states pt is on med that makes he feel unlike himself. Mom is aware of Behavior[facility] Ur[facility]. Pt was released last night from [facility]. Mom states pt has wished death upon her but no intentions of harm to self or anyone around.

## 2020-04-03 NOTE — Telephone Encounter (Signed)
Mom -Alejandro Wood is calling on patient that is having some mental issues going on right now.His girlfriend broke up with him and now he has been walking over to her house just watching outside. But mom is concerned that he might hurt them or his self. He went to [facility] for three days but was leased and has not been hisself since,he has started going back to his ex-girlfriend house again so Tabitha sent him to live with his sister. Alejandro Wood is wanting to know what to do next to help him . Please advise

## 2020-04-25 ENCOUNTER — Other Ambulatory Visit: Payer: Self-pay

## 2020-04-25 ENCOUNTER — Ambulatory Visit (HOSPITAL_COMMUNITY)
Admission: EM | Admit: 2020-04-25 | Discharge: 2020-04-26 | Disposition: A | Discharge status: Home / Self Care | Payer: Medicaid Other | Attending: Behavioral Health | Admitting: Behavioral Health

## 2020-04-25 DIAGNOSIS — R440 Auditory hallucinations: Secondary | ICD-10-CM | POA: Insufficient documentation

## 2020-04-25 DIAGNOSIS — Z91128 Patient's intentional underdosing of medication regimen for other reason: Secondary | ICD-10-CM | POA: Insufficient documentation

## 2020-04-25 DIAGNOSIS — F29 Unspecified psychosis not due to a substance or known physiological condition: Secondary | ICD-10-CM

## 2020-04-25 DIAGNOSIS — R441 Visual hallucinations: Secondary | ICD-10-CM | POA: Insufficient documentation

## 2020-04-26 ENCOUNTER — Telehealth (HOSPITAL_COMMUNITY): Payer: Self-pay | Admitting: Family Medicine

## 2020-04-26 DIAGNOSIS — R440 Auditory hallucinations: Secondary | ICD-10-CM | POA: Diagnosis not present

## 2020-04-26 DIAGNOSIS — R441 Visual hallucinations: Secondary | ICD-10-CM | POA: Diagnosis not present

## 2020-04-26 DIAGNOSIS — Z91128 Patient's intentional underdosing of medication regimen for other reason: Secondary | ICD-10-CM | POA: Diagnosis not present

## 2020-04-26 NOTE — BH Assessment (Incomplete)
Comprehensive Clinical Assessment (CCA) Note  04/26/2020 [name] [id number]  Chief Complaint: No chief complaint on file.  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 Acute risk factors for suicide include: family or marital conflict and social withdrawal/isolation. Protective factors for this patient include: positive therapeutic relationship, coping skills and hope for the future. Considering these factors, the overall suicide risk at this point appears to be Patient  appropriate for outpatient follow up.  Disposition: Cecilio Asper NP, recommends pt to be discharged and follow      Flowsheet Row ED from 04/25/2020 in [facility] ED from 03/28/2020 in [facility] EMERGENCY DEPARTMENT Admission (Discharged[facility]  C-SSRS RISK CATEGORY No Risk Error: Q3, 4, or 5 should not be populated when Q2 is No No Risk        Alejandro Wood is a 20 year old single patient who presents voluntarily to [facility] and accompanied by his mother Alejandro Wood [phone], who participated in assessment at Pt's request.  Pt reports he has a history of auditory and visual hallucinations.  Pt reports "I feel my head  Is up in the clouds".  Pt also reports that he talks to himself and she sees shadows and asian zombies.  Pt acknowledged that he is having an panic attacks; also reports experiencing  brief episodes of paranoia in the past when taking pain medications..  Pt reports that he sleeps three hours a night; also reports that his appetite is fine.  Pt denies any manic symptoms.  Pt denies SI and prior suicide attempts with no plan.  Pt denies any  history of intentional self injurious behaviors, with no plan.  Pt denies any current homicidal ideation or history of violence, with no plan. Pt reports that he smoked marijuana three months ago 01/2020; also, denies using any other substance use.  Pt admits to vaping daily.  Pt identifies his primary stressor as being able to control his racing thoughts and self control, as with reducing episodes of angra, argumentative, and irritability towards his family.  Pt reports that he lives with both parents and three siblings.   Pt reports a maternal family history of schizophrenia.  Pt denies any history of abuse or trauma.  Pt denies any current legal problems.    Pt says he is currently not receiving weekly outpatient therapy.  Pt reports that he is receiving prescribed medication and he have not been taking it " makes me feel stupid".  Pt reports one previous inpatient psychiatric hospitalization Oct 2020 at [facility].  Pt is dressed causal, alert, oriented x 4 with normal speech and restless motor behavior.  Eye contact is good.  Pt mood is anxious and affect is anxious.  Thought process is coherent.  Pt's insight is good and judgement is fair.  There is no indication Pt is currently responding to internal stimuli  Or experiencing delusional thought content.  Pt was irritable throughout assessment.  Pt's mother says she is very fearful pt will not take the medication, she feels that he needs the long injection.     CCA Screening, Triage and Referral (STR)  Patient Reported Information How did you hear about [place]? Self  Referral name: No data recorded Referral  phone number: No data recorded  Whom do you see for routine medical problems? Primary Care  Practice/Facility Name: [facility] medicine  Practice/Facility Phone Number: No data recorded Name of Contact: Dr. Lynnae Sandhoff Number: No data recorded Contact Fax Number: No data recorded Prescriber Name: No data recorded Prescriber  Address (if known): No data recorded  What Is the Reason for Your Visit/Call Today? Medication noncompliance; racing thoughts; hallucinations  How Long Has This Been Causing You Problems? <Week  What Do You Feel Would Help You the Most Today? Medication(s); Treatment for Depression or other mood problem   Have You Recently Been in Any Inpatient Treatment (Hospital/Detox/Crisis Center/28-Day Program)? No  Name/Location of Program/Hospital:No data recorded How Long Were You There? No data recorded When Were You Discharged? No data recorded  Have You Ever Received Services From [facility] Before? Yes  Who Do You See at [facility]? Pt was on the [facility] C/A unit in 10/2018   Have You Recently Had Any Thoughts About Hurting Yourself? No  Are You Planning to Commit Suicide/Harm Yourself At This time? No   Have you Recently Had Thoughts About Hurting Someone Alejandro Wood? No  Explanation: No data recorded  Have You Used Any Alcohol or Drugs in the Past 24 Hours? No  How Long Ago Did You Use Drugs or Alcohol? No data recorded What Did You Use and How Much? No data recorded  Do You Currently Have a Therapist/Psychiatrist? Yes  Name of Therapist/Psychiatrist: Dr. Criss Alvine with [facility] does psychiatric meds.   Have You Been Recently Discharged From Any Office Practice or Programs? No  Explanation of Discharge From Practice/Program: No data recorded    CCA Screening Triage Referral Assessment Type of Contact: Tele-Assessment  Is this Initial or Reassessment? Initial Assessment  Date Telepsych consult ordered in [facility]:  03/28/2020  Time Telepsych consult ordered in [facility]:  2319   Patient Reported Information Reviewed? Yes  Patient Left Without Being Seen? No data recorded Reason for Not Completing Assessment: No data recorded  Collateral Involvement: Howie Rufus (mother) [phone]   Does Patient Have a Court Appointed Legal Guardian? No data recorded Name and Contact of Legal  Guardian: No data recorded If Minor and Not Living with Parent(s), Who has Custody? No data recorded Is CPS involved or ever been involved? Never  Is APS involved or ever been involved? Never   Patient Determined To Be At Risk for Harm To Self or Others Based on Review of Patient Reported Information or Presenting Complaint? No  Method: No data recorded Availability of Means: No data recorded Intent: No data recorded Notification Required: No data recorded Additional Information for Danger to Others Potential: No data recorded Additional Comments for Danger to Others Potential: No data recorded Are There Guns or Other Weapons in Your Home? No data recorded Types of Guns/Weapons: No data recorded Are These Weapons Safely Secured?                            No data recorded Who Could Verify You Are Able To Have These Secured: No data recorded Do You Have any Outstanding Charges, Pending Court Dates, Parole/Probation? No data recorded Contacted To Inform of Risk of Harm To Self or Others: Other: Comment (Mother is aware of threat to her safety.)   Location of Assessment: [facility] ED   Does Patient Present under Involuntary Commitment? Yes  IVC Papers Initial File Date: 03/28/2020   [place] of  Residence: [place]   Patient Currently Receiving the Following Services: Medication Management   Determination of Need: Routine (7 days)   Options For Referral: -- (UTA)     CCA Biopsychosocial Intake/Chief Complaint:  Psychotic  Current Symptoms/Problems: hearing voices   Patient Reported Schizophrenia/Schizoaffective Diagnosis in Past: No   Strengths: UTA  Preferences: UTA  Abilities: UTA   Type of Services Patient Feels are Needed: UTA   Initial Clinical Notes/Concerns: Civil engineer, contracting, Impulisive   Mental Health Symptoms Depression:  None   Duration of Depressive symptoms: No data recorded  Mania:  None   Anxiety:   Restlessness; Sleep; Fatigue; Irritability    Psychosis:  Hallucinations   Duration of Psychotic symptoms: Less than six months   Trauma:  None   Obsessions:  Cause anxiety; Recurrent & persistent thoughts/impulses/images   Compulsions:  Disrupts with routine/functioning; Repeated behaviors/mental acts   Inattention:  None   Hyperactivity/Impulsivity:  N/A   Oppositional/Defiant Behaviors:  Angry; Argumentative; Defies rules; Easily annoyed; Intentionally annoying; Resentful; Temper; Aggression towards people/animals   Emotional Irregularity:  None   Other Mood/Personality Symptoms:  paranoia    Mental Status Exam Appearance and self-care  Stature:  Average   Weight:  Average weight   Clothing:  Casual   Grooming:  Normal   Cosmetic use:  Age appropriate   Posture/gait:  Normal   Motor activity:  Agitated   Sensorium  Attention:  Normal   Concentration:  Normal   Orientation:  Object; Person; Place; Situation   Recall/memory:  Normal   Affect and Mood  Affect:  Anxious   Mood:  Anxious   Relating  Eye contact:  Normal   Facial expression:  Anxious   Attitude toward examiner:  Irritable   Thought and Language  Speech flow: Normal   Thought content:  Appropriate to Mood and Circumstances   Preoccupation:  Guilt   Hallucinations:  Visual; Auditory   Organization:  No data recorded  [employer] of Knowledge:  Fair   Intelligence:  Average   Abstraction:  Concrete   Judgement:  Fair   Reality Testing:  Realistic   Insight:  Good   Decision Making:  Impulsive   Social Functioning  Social Maturity:  Self-centered   Social Judgement:  -- (UTA)   Stress  Stressors:  Family conflict; Relationship   Coping Ability:  Overwhelmed   Skill Deficits:  Self-care; Self-control; Interpersonal; Decision making   Supports:  Friends/Service system     Religion: Religion/Spirituality Are You A Religious Person?:  Industrial/product designer)  Leisure/Recreation: Leisure / Recreation Do  You Have Hobbies?:  (UTA)  Exercise/Diet: Exercise/Diet Do You Exercise?: Yes What Type of Exercise Do You Do?: Run/Walk How Many Times a Week Do You Exercise?: 1-3 times a week Have You Gained or Lost A Significant Amount of Weight in the Past Six Months?: No Do You Follow a Special Diet?: No Do You Have Any Trouble Sleeping?: Yes Explanation of Sleeping Difficulties: Pt reports that he is sleeping three hours a night   CCA Employment/Education Employment/Work Situation: Employment / Work Environmental consultant job has been impacted by current illness: Yes Describe how patient's job has been impacted: 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, and roofing What is the longest time patient has a held a job?: UTA Where was the patient employed at that time?: UTA Has patient ever been in the [employer]?: No  Education: Education Is Patient Currently Attending School?: No Last Grade Completed: 12 Name of High School: [employer] Did You Graduate From High School?: Yes Did You Attend College?: No Did You Attend Graduate School?: No Did You Have Any Special Interests In School?: UTA Did You Have An Individualized Education Program (IIEP): Yes Did You Have Any Difficulty At School?: Yes Were Any Medications Ever Prescribed For These Difficulties?: No Patient's Education Has Been Impacted by Current Illness: Yes How Does Current Illness Impact Education?: Difficulty concentrating   CCA Family/Childhood History Family and Relationship History: Family history Are you sexually active?:  (UTA) What is your sexual orientation?: UTA Has your sexual activity been affected by drugs, alcohol, medication, or emotional stress?: UTA Does patient have children?: No  Childhood History:  Childhood History By whom was/is the patient raised?: Both parents Additional childhood history information: UTA Description of patient's relationship with caregiver when they were a child: caring Patient's  description of current relationship with people who raised him/her: supportive How were you disciplined when you got in trouble as a child/adolescent?: UTA Does patient have siblings?: Yes Number of Siblings: 3 Description of patient's current relationship with siblings: close Did patient suffer any verbal/emotional/physical/sexual abuse as a child?: No Did patient suffer from severe childhood neglect?: No Has patient ever been sexually abused/assaulted/raped as an adolescent or adult?: No Was the patient ever a victim of a crime or a disaster?: No Witnessed domestic violence?: No Has patient been affected by domestic violence as an adult?: No  Child/Adolescent Assessment:     CCA Substance Use Alcohol/Drug Use: Alcohol / Drug Use Pain Medications: See MRA Prescriptions: See MRA Over the Counter: See MRA History of alcohol / drug use?: Yes Substance #1 Name of Substance 1: Marijuana 1 - Age of First Use: UTA 1 - Amount (size/oz): UTA 1 - Frequency: UTA 1 - Duration: UTA 1 - Last Use / Amount: 3 months ago 1 - Method of Aquiring: UTA 1- Route of Use: smoking                       ASAM's:  Six Dimensions of Multidimensional Assessment  Dimension 1:  Acute Intoxication and/or Withdrawal Potential:   Dimension 1:  Description of individual's past and current experiences of substance use and withdrawal: Pt reports that he smoked marijuana in 01/2020, have not smoke any more  Dimension 2:  Biomedical Conditions and Complications:   Dimension 2:  Description of patient's biomedical conditions and  complications: no medical reported  Dimension 3:  Emotional, Behavioral, or Cognitive Conditions and Complications:  Dimension 3:  Description of emotional, behavioral, or cognitive conditions and complications: Hearing auditory and visuall hallucinations.  Dimension 4:  Readiness to Change:  Dimension 4:  Description of Readiness to Change criteria: action stage  Dimension 5:   Relapse, Continued use, or Continued Problem Potential:  Dimension 5:  Relapse, continued use, or continued problem potential critiera description: Pt reports that he does not take prescribed mental medication  Dimension 6:  Recovery/Living Environment:  Dimension 6:  Recovery/Iiving environment criteria description: supportive family and home  ASAM Severity Score: ASAM's Severity Rating Score: 8  ASAM Recommended Level of Treatment:     Substance use Disorder (SUD) Substance Use Disorder (SUD)  Checklist Symptoms of Substance Use: Evidence of withdrawal (Comment)  Recommendations for Services/Supports/Treatments: Recommendations for Services/Supports/Treatments Recommendations For Services/Supports/Treatments: ACCTT (Assertive Community Treatment),Individual Therapy  DSM5 Diagnoses: Patient Active Problem List   Diagnosis Date Noted  . Autism spectrum 03/29/2020  . 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 Depression  03/29/2020  . Suicidal ideations 03/29/2020  . OCD (obsessive compulsive disorder) 11/13/2018  . Hallucinations 11/12/2018  . Learning disability 10/26/2015  . Groin strain 05/13/2013  . ADD (attention deficit disorder) 06/19/2012      Referrals to Alternative Service(s): Referred to Alternative Service(s):   Place:   Date:   Time:    Referred to Alternative Service(s):   Place:   Date:   Time:    Referred to Alternative Service(s):   Place:   Date:   Time:    Referred to Alternative Service(s):   Place:   Date:   Time:     Meryle Readyijuana  Faulks, Counselor

## 2020-04-26 NOTE — Discharge Instructions (Addendum)
°  Discharge recommendations:  Patient is to take medications as prescribed. Please see information for follow-up appointment with psychiatry and therapy. Please follow up with your primary care provider for all medical related needs.   Therapy: We recommend that patient participate in individual therapy to address mental health concerns. [facility] hours: mon-Thur 8am-11am, Friday 8am-5pm (walk-ins, please arrive early)  Medications: The parent/guardian is to contact a medical professional and/or outpatient provider to address any new side effects that develop. Parent/guardian should update outpatient providers of any new medications and/or medication changes.   Atypical antipsychotics: If you are prescribed an atypical antipsychotic, it is recommended that your height, weight, BMI, blood pressure, fasting lipid panel, and fasting blood sugar be monitored by your outpatient providers.  Safety:  The patient should abstain from use of illicit substances/drugs and abuse of any medications. If symptoms worsen or do not continue to improve or if the patient becomes actively suicidal or homicidal then it is recommended that the patient return to the closest hospital emergency department, the [facility], or call 911 for further evaluation and treatment. National Suicide Prevention Lifeline 1-800-SUICIDE or 603-209-2911.

## 2020-04-26 NOTE — Telephone Encounter (Signed)
Care Management - Follow Up [facility] Discharges   Writer made contact with the patient.  Patient reports that she has not made an appointment with a psychiatrist or therapist.   Writer provided patient with the following resources:       Writer informed patient that she is able to come to [facility] without an appointment Mon thru Thurs from 8am to 11am.  Writer stressed that these appointments are first come first serve.    

## 2020-04-26 NOTE — ED Provider Notes (Signed)
Behavioral Health Urgent Care Medical Screening Exam  Patient Name: Alejandro Wood MRN: [id number] Date of Evaluation: 04/26/20 Chief Complaint:   Diagnosis:  Final diagnoses:  None    History of Present illness: Alejandro Wood is a 20 y.o. male. Presented voluntarily to [facility] accompanied by his mother and father who also participated in assessment per patient's request. Patient presented with the chief complaint of "I feel confused with the situation in my head, with my thoughts, and my mood keeps changing." patient reports auditory hallucination of "hearing voices or my conscience telling me to raise my head up and telling me to climb up and down the stairs without holding on to anything. He reports that he has not been taking his psychtropic medications because "they make me feel stupid." He also admits to visual hallucination of "I see an Asian man that looks like a zombie." He reports that he was seeing a therapist at [facility] but believes that his therapist retired or transferred to a different department. He states that he would like to be set up with a male therapist at [facility] and wants his medications switched to injectable medications. He reports suicidal thought about 3 weeks ago but denies current suicidal ideations. He stated "3weeks ago I thought about blowing my head off or cuting my thorat and dying a slow and painful death." Review of chart shows patient was evaluated on 03/28/2020 for suicidal ideation. He reports his stressors are "raising thoughts and trouble sleeping." patient reports that he stays up all night and sleeps during the day. He report that he typically sleep from 8am-3pm.   Patient's mother Alejandro Wood, [phone] confirmed information provided by patient. She reports that she is unsatisfied with the services patient is receiving at [facility]. She states "everything is virtual, how can they properly treat him if they don't see him in person." She would like patient to be  seem by a therapist at [facility] and wants patient to be switch to injectable psychotropic medication so he can be more complaint. She states "he has not taken his medication since he left the hospital." She reports that patient is restless because he is not taking his medication. She states "He creates different challenges and scenarios for himself to accomplish every day, and gets upset when he can not accomplish them." She reports that she has no safety concerns for him.   Patient denies suicidal and homicidal ideations, he endorses visual and auditory hallucinations, he denies paranoia. There is no indication of delusions. Patient reports that he denies at home with his mother, father, 3 sisters,1 brother and a brother in Social worker. 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 He endorse marijuana use ~3 months ago but none currently. He denies other illicit drug use and alcohol use.   Patient refused observation at [facility] he states "I don't want to stay here, I just want the doctor to give me medication that will get ride of the voices and not make be feel stupid." This writer discuss out patient therapy and medication management with patient and his parents. They are agreeable to coming to plan.   Psychiatric Specialty Exam  Presentation  General Appearance:Appropriate for Environment  Eye Contact:Good  Speech:Pressured  Speech Volume:Normal  Handedness:Right   Mood and Affect  Mood:Anxious  Affect:Congruent   Thought Process  Thought Processes:Coherent  Descriptions of Associations:Intact  Orientation:Full (Time, Place and Person)  Thought Content:WDL  Hallucinations:Auditory; Visual "voices telling me to raise my head up  and climb up and down the stairs without touching anything" "An Asian man that looks like a zombie"  Ideas of Reference:None  Suicidal Thoughts:No  Homicidal Thoughts:No   Sensorium  Memory:Immediate Good; Recent Good; Remote  Good  Judgment:Poor  Insight:Poor   Executive Functions  Concentration:No data recorded Attention Span:Good  Recall:Good  Fund of Knowledge:No data recorded Language:No data recorded  Psychomotor Activity  Psychomotor Activity:Normal   Assets  Assets:Communication Skills; Desire for Improvement; Financial Resources/Insurance; Housing; Physical Health; Social Lawyer; Talents/Skills; Vocational/Educational   Sleep  Sleep:Poor  Number of hours: No data recorded  Nutritional Assessment (For OBS and [facility] admissions only) Has the patient had a weight loss or gain of 10 pounds or more in the last 3 months?: No Has the patient had a decrease in food intake/or appetite?: No Does the patient have dental problems?: No Does the patient have eating habits or behaviors that may be indicators of an eating disorder including binging or inducing vomiting?: No Has the patient recently lost weight without trying?: No Has the patient been eating poorly because of a decreased appetite?: No Malnutrition Screening Tool Score: 0    Physical Exam: Physical Exam Vitals reviewed.  Constitutional:      General: He is not in acute distress.    Appearance: He is not toxic-appearing.  HENT:     Head: Normocephalic.  Eyes:     General:        Right eye: No discharge.        Left eye: No discharge.  Cardiovascular:     Rate and Rhythm: Normal rate.  Pulmonary:     Effort: No respiratory distress.  Musculoskeletal:     Cervical back: No rigidity.  Neurological:     Mental Status: He is alert and oriented to person, place, and time.  Psychiatric:        Attention and Perception: He perceives auditory and visual hallucinations.        Mood and Affect: Mood is anxious.        Speech: Speech normal.        Behavior: Behavior normal. Behavior is cooperative.        Thought Content: Thought content is not paranoid or delusional. Thought content does not include homicidal or  suicidal ideation. Thought content does not include homicidal or suicidal plan.        Cognition and Memory: Cognition normal.        Judgment: Judgment normal.    ROS Blood pressure 116/73, pulse 67, temperature 98.7 F (37.1 C), temperature source Oral, resp. rate 18, height 5\' 9"  (1.753 m), weight 165 lb (74.8 kg), SpO2 100 %. Body mass index is 24.37 kg/m.  Musculoskeletal: Strength & Muscle Tone: within normal limits Gait & Station: normal Patient leans: Right   [facility] MSE Discharge Disposition for Follow up and Recommendations: Based on my evaluation the patient does not appear to have an emergency medical condition and can be discharged with resources and follow up care in outpatient services for Medication Management and Individual Therapy  Discussed Open Access outpatient therapy and medication management with patient and his parents. They are agreeable to coming to plan.   , NP 04/26/2020, 12:30 AM

## 2021-03-29 ENCOUNTER — Ambulatory Visit (INDEPENDENT_AMBULATORY_CARE_PROVIDER_SITE_OTHER): Payer: Medicaid Other | Admitting: Family Medicine

## 2021-03-29 ENCOUNTER — Other Ambulatory Visit: Payer: Self-pay

## 2021-03-29 VITALS — BP 118/75 | HR 87 | Temp 98.8°F | Ht 69.0 in | Wt 155.2 lb

## 2021-03-29 DIAGNOSIS — Z0000 Encounter for general adult medical examination without abnormal findings: Secondary | ICD-10-CM | POA: Diagnosis not present

## 2021-03-29 NOTE — Progress Notes (Signed)
° °  Subjective:    Patient ID: Alejandro Wood, male    DOB: [birth year], 20 y.o.   MRN: [id number]  HPI  The patient comes in today for a wellness visit.  Patient feels he is doing fairly well He states he is applying for some jobs Lack of transportation hindering him He is trying to get his license He states he did have mental health issues but states that is doing much better and is no longer on medicine   A review of their health history was completed.  A review of medications was also completed.  Any needed refills; None  Eating habits: eats good but not as much as he should  Wood/  MVA accidents in past few months: No  Regular exercise: used to to run and do push ups but stopped.  Specialist pt sees on regular basis: No  Preventative health issues were discussed.   Additional concerns: No   Review of Systems     Objective:   Physical Exam  General-in no acute distress Eyes-no discharge Lungs-respiratory rate normal, CTA CV-no murmurs,RRR Extremities skin warm dry no edema Neuro grossly normal Behavior normal, alert       Assessment & Plan:  Adult wellness-complete.wellness physical was conducted today. Importance of diet and exercise were discussed in detail.  In addition to this a discussion regarding safety was also covered. We also reviewed over immunizations and gave recommendations regarding current immunization needed for age.  In addition to this additional areas were also touched on including: Preventative health exams needed:  Colonoscopy not indicated  Patient was advised yearly wellness exam

## 2021-03-29 NOTE — Patient Instructions (Signed)
It was good to see you today. Please do the best she can and eating healthy and staying active. Good luck with your job search Please follow-up in 1 year or sooner if any problems TakeCare-Dr. Lorin Picket

## 2021-06-28 ENCOUNTER — Other Ambulatory Visit: Payer: Self-pay

## 2021-06-28 ENCOUNTER — Emergency Department (HOSPITAL_BASED_OUTPATIENT_CLINIC_OR_DEPARTMENT_OTHER)
Admission: EM | Admit: 2021-06-28 | Discharge: 2021-06-28 | Disposition: A | Discharge status: Home / Self Care | Payer: Medicaid Other | Attending: Emergency Medicine | Admitting: Emergency Medicine

## 2021-06-28 ENCOUNTER — Emergency Department (HOSPITAL_BASED_OUTPATIENT_CLINIC_OR_DEPARTMENT_OTHER): Payer: Medicaid Other | Admitting: Radiology

## 2021-06-28 ENCOUNTER — Encounter (HOSPITAL_BASED_OUTPATIENT_CLINIC_OR_DEPARTMENT_OTHER): Payer: Self-pay | Admitting: Obstetrics and Gynecology

## 2021-06-28 DIAGNOSIS — M25531 Pain in right wrist: Secondary | ICD-10-CM | POA: Insufficient documentation

## 2021-06-28 DIAGNOSIS — S60221A Contusion of right hand, initial encounter: Secondary | ICD-10-CM | POA: Diagnosis not present

## 2021-06-28 DIAGNOSIS — S6991XA Unspecified injury of right wrist, hand and finger(s), initial encounter: Secondary | ICD-10-CM | POA: Diagnosis present

## 2021-06-28 DIAGNOSIS — W228XXA Striking against or struck by other objects, initial encounter: Secondary | ICD-10-CM | POA: Insufficient documentation

## 2021-06-28 MED ORDER — IBUPROFEN 800 MG PO TABS
800.0000 mg | ORAL_TABLET | Freq: Once | ORAL | Status: AC
  Administered 2021-06-28: 800 mg via ORAL
  Filled 2021-06-28: qty 1

## 2021-06-28 NOTE — Discharge Instructions (Signed)
X-rays are negative for fracture.  Overall suspect you have a bone contusion.  Recommend Tylenol, ibuprofen, ice.  Follow-up with your primary care doctor. ?

## 2021-06-28 NOTE — ED Triage Notes (Signed)
Patient reports he punched a fridge with his right hand. Denies SI/HI. Reports pain in the wrist that is tingling ?

## 2021-06-28 NOTE — ED Provider Notes (Signed)
?[facility] EMERGENCY DEPT ?Provider Note ? ? ?CSN: [id number] ?Arrival date & time: 06/28/21  6433 ? ?  ? ?History ?Chief Complaint  ?Patient presents with  ? Hand Pain  ? ? ?Alejandro Wood is a 21 y.o. male who presents the emergency department with right hand and wrist pain after punching a fridge that occurred just prior to arrival.  Patient states that he was angry and punched a fridge out of anger.  Denies any homicidal suicidal ideations.  Complains of pain localized to the fourth and fifth MCPs.  Also states he is having some pain in the wrist as well.  No other injury. ? ? ?Hand Pain ? ? ?  ? ?Home Medications ?Prior to Admission medications   ?Medication Sig Start Date End Date Taking? Authorizing Provider  ?benztropine (COGENTIN) 0.5 MG tablet Take 1 tablet (0.5 mg total) by mouth 2 (two) times daily. ?Patient not taking: Reported on 03/29/2020 11/16/18   Leata Mouse, MD  ?diphenhydrAMINE (BENADRYL) 50 MG capsule Take 1 capsule (50 mg total) by mouth every 8 (eight) hours as needed for sleep (Anxiety and agitation). ?Patient not taking: Reported on 03/29/2020 11/15/18   Leata Mouse, MD  ?FLUoxetine (PROZAC) 20 MG capsule Take 1 capsule (20 mg total) by mouth daily. ?Patient not taking: Reported on 03/29/2020 11/16/18   Leata Mouse, MD  ?ondansetron (ZOFRAN ODT) 4 MG disintegrating tablet Take 1 tablet (4 mg total) by mouth every 8 (eight) hours as needed for nausea or vomiting. ?Patient not taking: Reported on 03/29/2020 08/29/19   Merrilee Jansky, MD  ?risperiDONE (RISPERDAL M-TABS) 1 MG disintegrating tablet Take 1 tablet (1 mg total) by mouth daily. ?Patient not taking: Reported on 03/29/2021 03/31/20   Aldean Baker, NP  ?risperiDONE (RISPERDAL) 2 MG tablet Take 1 tablet (2 mg total) by mouth at bedtime. ?Patient not taking: Reported on 03/29/2021 03/30/20   Aldean Baker, NP  ?   ? ?Allergies    ?Patient has no known allergies.   ? ?Review of Systems    ?Review of Systems  ?All other systems reviewed and are negative. ? ?Physical Exam ?Updated Vital Signs ?BP 112/74   Pulse 63   Temp 99.1 ?F (37.3 ?C) (Oral)   Resp 16   Ht 5\' 9"  (1.753 m)   Wt 76.2 kg   SpO2 99%   BMI 24.81 kg/m?  ?Physical Exam ?Vitals and nursing note reviewed.  ?Constitutional:   ?   Appearance: Normal appearance.  ?HENT:  ?   Head: Normocephalic and atraumatic.  ?Eyes:  ?   General:     ?   Right eye: No discharge.     ?   Left eye: No discharge.  ?   Conjunctiva/sclera: Conjunctivae normal.  ?Pulmonary:  ?   Effort: Pulmonary effort is normal.  ?Musculoskeletal:  ?   Comments: Patient has full range of motion in the fingers.  He is neurovascular intact in the wrist and hand.  Good cap refill distally in all 5 fingers.  Good strength.  ?Skin: ?   General: Skin is warm and dry.  ?   Findings: No rash.  ?   Comments: Superficial small abrasion over the right fifth MCP.    ?Neurological:  ?   General: No focal deficit present.  ?   Mental Status: He is alert.  ?Psychiatric:     ?   Mood and Affect: Mood normal.     ?   Behavior: Behavior normal.  ? ? ?  ED Results / Procedures / Treatments   ?Labs ?(all labs ordered are listed, but only abnormal results are displayed) ?Labs Reviewed - No data to display ? ?EKG ?None ? ?Radiology ?DG Wrist Complete Right ? ?Result Date: 06/28/2021 ?CLINICAL DATA:  Punched a refrigerator.  Right wrist pain. EXAM: RIGHT WRIST - COMPLETE 3+ VIEW COMPARISON:  None Available. FINDINGS: There is no evidence of fracture or dislocation. There is no evidence of arthropathy or other focal bone abnormality. Soft tissues are unremarkable. IMPRESSION: Negative. Electronically Signed   By: Danae Orleans M.D.   On: 06/28/2021 09:06  ? ?DG Hand Complete Right ? ?Result Date: 06/28/2021 ?CLINICAL DATA:  Punched a refrigerator this morning. Pain mainly in region of 5th digit and wrist. EXAM: RIGHT HAND - COMPLETE 3+ VIEW COMPARISON:  None Available. FINDINGS: There is no evidence  of fracture or dislocation. There is no evidence of arthropathy or other focal bone abnormality. Soft tissues are unremarkable. IMPRESSION: Negative. Electronically Signed   By: Danae Orleans M.D.   On: 06/28/2021 09:36   ? ?Procedures ?Procedures  ? ? ?Medications Ordered in ED ?Medications  ?ibuprofen (ADVIL) tablet 800 mg (800 mg Oral Given 06/28/21 0937)  ? ? ?ED Course/ Medical Decision Making/ A&P ?  ?                        ?Medical Decision Making ?[name] is a 21 y.o. male who presents to the emergency department after punching a fridge.  Differential diagnosis includes fracture dislocation of the hand or fingers, soft tissue contusion.  No evidence of compartment syndrome at this time. ? ? ?Amount and/or Complexity of Data Reviewed ?External Data Reviewed: radiology. ?   Details: Patient has never had any imaging over the right hand or wrist before. ?Radiology: ordered and independent interpretation performed. ?   Details: I ordered her and personally interpreted x-rays of the right wrist and hand.  No evidence of fractures or dislocations. ? ?Risk ?OTC drugs. ?Prescription drug management. ?Decision regarding hospitalization. ?Risk Details: Patient has full range of motion in the right hand.  There is no obvious step-offs or deformities.  Hand is not significantly swollen.  There is a small superficial abrasion of the right fifth MCP.  I discussed wound care with him at the bedside in addition to having him follow-up with his primary care provider for further evaluation.  Tylenol and Motrin for pain and swelling.  Ice for the next 1 to 2 days and then can switch to heat.  He was encouraged to return to the emerged part for any worsening symptoms that he might have.  Overall, patient does not meet inpatient criteria at this time and safe for discharge ? ? ?Final Clinical Impression(s) / ED Diagnoses ?Final diagnoses:  ?Contusion of right hand, initial encounter  ? ? ?Rx / DC Orders ?ED Discharge  Orders   ? ? None  ? ?  ? ? ?  ?Honor Loh [place], PA-C ?06/28/21 0945 ? ?  ?Virgina Norfolk, DO ?06/28/21 6812 ? ?

## 2023-03-16 ENCOUNTER — Ambulatory Visit: Payer: Self-pay | Admitting: Family Medicine

## 2023-03-16 NOTE — Telephone Encounter (Signed)
Copied from CRM [id number]. Topic: Clinical - Red Word Triage >> Mar 16, 2023  2:29 PM Clayton Bibles wrote: Red Word that prompted transfer to Nurse Triage: Severe Pain in lower back in left side;  when sitting, laying or standing for a long period he has severe pain, no fever or chills, sometimes pain runs up his leg. It starts from knee and goes up to lower back. He is taking no medication   Chief Complaint:  Patient requesting to  re-establish care with Dr Lorin Picket  regarding his Lower back pain and knee pain.  Patient states back pain in lower back in left side;  when sitting, laying or standing for a long period, Patient denies any injuries, no fever or chills, sometimes pain runs up his leg. It starts from knee and goes up to lower back. He is taking no medication Rates pain  from moderate to severe at times.  Rn Agent attempted to schedule him with an sooner appointment within the practice . Patient declined because he rather see Dr Lorin Picket only. Symptoms:  Pain in Lower back and in both knees. Frequency:   Over several months same continuous pain. Pertinent Negatives: Patient denies injury.  Disposition: [] ED /[] Urgent Care (no appt availability in office) / [x] Appointment(In office/virtual)/ []  [place] [facility]/ [] Home Care/ [] Refused Recommended Disposition /[] [place] [facility]/ []  Follow-up with PCP Additional Notes: Scheduled for May 03 2023 Reason for Disposition  [1] MODERATE back pain (e.g., interferes with normal activities) AND [2] present > 3 days  Answer Assessment - Initial Assessment Questions 1. ONSET: "When did the pain begin?"       Left  Lower back    2. LOCATION: "Where does it hurt?" (upper, mid or lower back)    Lower back 3. SEVERITY: "How bad is the pain?"  (e.g., Scale 1-10; mild, moderate, or severe)   - MILD (1-3): Doesn't interfere with normal activities.    - MODERATE (4-7): Interferes with normal activities or awakens from sleep.    - SEVERE (8-10):  Excruciating pain, unable to do any normal activities.       Intense  pain  rates it 8 out 10 4. PATTERN: "Is the pain constant?" (e.g., yes, no; constant, intermittent)      Yes, It is constant. He can't  lay down or sit for long lengths of time. 5. RADIATION: "Does the pain shoot into your legs or somewhere else?"      Starts from  Left knee and goes all the way up his left lower back 6. 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 BACK OVERUSE:  "Any recent lifting of heavy objects, strenuous work or exercise?" Dealing with the pain  for a few years. He used to work in Therapist, music. He believes the overuse and lifting could have caused   increase. 8. MEDICINES: "What have you taken so far for the pain?" (e.g., nothing, acetaminophen, NSAIDS)       No medicine. He doesn't like to take any medicines. 9. NEUROLOGIC SYMPTOMS: "Do you have any weakness, numbness, or problems with bowel/bladder control?"      Denies 10. OTHER SYMPTOMS: "Do you have any other symptoms?" (e.g., fever, abdomen pain, burning with urination, blood in urine)        Denies  Answer Assessment - Initial Assessment Questions 1. LOCATION and RADIATION: "Where is the pain located?"  Left knee above the knee   2. QUALITY: "What does the pain feel like?"  (e.g., sharp, dull, aching, burning)      Shooting pain that shoots up his legs  3. SEVERITY: "How bad is the pain?" "What does it keep you from doing?"   (Scale 1-10; or mild, moderate, severe)   -    -  MODERATE (4-7): interferes with normal activities (e.g., work or school) or awakens from sleep, limping         Rates pain as 4 out of 10  4. ONSET: "When did the pain start?" "Does it come and go, or is it there all the time?"      Stand at work. He has a tendency to put weight on one leg. 5. RECURRENT: "Have you had this pain before?" If Yes, ask: "When, and what happened then?"      A few years 6.  SETTING: "Has there been any recent work, exercise or other activity that involved that part of the body?"        Stand on  concrete daily  7. AGGRAVATING FACTORS: "What makes the knee pain worse?" (e.g., walking, climbing stairs, running)      Right knee  throbs in the winter time - This has been going on for a few years 8. ASSOCIATED SYMPTOMS: "Is there any swelling or redness of the knee?"     Denies- feels like it is in the middle  9. OTHER SYMPTOMS: "Do you have any other symptoms?" (e.g., chest pain, difficulty breathing, fever, calf pain)      Both Knee and back pain  Protocols used: Back Pain-A-AH, Knee Pain-A-AH

## 2023-05-03 ENCOUNTER — Encounter: Payer: Self-pay | Admitting: Family Medicine

## 2023-05-03 ENCOUNTER — Ambulatory Visit: Payer: Medicaid Other | Admitting: Family Medicine

## 2023-05-16 ENCOUNTER — Encounter (HOSPITAL_COMMUNITY): Payer: Self-pay

## 2023-05-16 ENCOUNTER — Ambulatory Visit: Payer: Self-pay

## 2023-05-16 ENCOUNTER — Emergency Department (HOSPITAL_COMMUNITY)
Admission: EM | Admit: 2023-05-16 | Discharge: 2023-05-16 | Disposition: A | Discharge status: Home / Self Care | Payer: Self-pay | Attending: Emergency Medicine | Admitting: Emergency Medicine

## 2023-05-16 ENCOUNTER — Other Ambulatory Visit: Payer: Self-pay

## 2023-05-16 DIAGNOSIS — Z711 Person with feared health complaint in whom no diagnosis is made: Secondary | ICD-10-CM

## 2023-05-16 DIAGNOSIS — F840 Autistic disorder: Secondary | ICD-10-CM | POA: Insufficient documentation

## 2023-05-16 DIAGNOSIS — Z202 Contact with and (suspected) exposure to infections with a predominantly sexual mode of transmission: Secondary | ICD-10-CM | POA: Insufficient documentation

## 2023-05-16 LAB — RAPID HIV SCREEN (HIV 1/2 AB+AG)
HIV 1/2 Antibodies: NONREACTIVE
HIV-1 P24 Antigen - HIV24: NONREACTIVE

## 2023-05-16 NOTE — ED Provider Notes (Signed)
 [place] [facility]ERGENCY DEPARTMENT AT [facility] Provider Note   CSN: [id number] Arrival date & time: 05/16/23  1612     History  Chief Complaint  Patient presents with   Exposure to STD    AZAD CALAME is a 23 y.o. male.  The history is provided by the patient and medical records.  Exposure to STD   23 y.o. M with hx of autism spectrum, depression, intermittent explosive disorder, ADD, learning disability, OCD, presenting to the ED for STD testing.  Patient reports he "fingered" a girl yesterday and noticed he had small cuts on his fingers.  He reports partner sent him a copy of recent STD test results that were negative but was from December and did not have HIV testing at that time.  Patient is currently asymptomatic.    Home Medications Prior to Admission medications   Medication Sig Start Date End Date Taking? Authorizing Provider  benztropine (COGENTIN) 0.5 MG tablet Take 1 tablet (0.5 mg total) by mouth 2 (two) times daily. Patient not taking: Reported on 03/29/2020 11/16/18   Leata Mouse, MD  diphenhydrAMINE (BENADRYL) 50 MG capsule Take 1 capsule (50 mg total) by mouth every 8 (eight) hours as needed for sleep (Anxiety and agitation). Patient not taking: Reported on 03/29/2020 11/15/18   Leata Mouse, MD  FLUoxetine (PROZAC) 20 MG capsule Take 1 capsule (20 mg total) by mouth daily. Patient not taking: Reported on 03/29/2020 11/16/18   Leata Mouse, MD  ondansetron (ZOFRAN ODT) 4 MG disintegrating tablet Take 1 tablet (4 mg total) by mouth every 8 (eight) hours as needed for nausea or vomiting. Patient not taking: Reported on 03/29/2020 08/29/19   Merrilee Jansky, MD  risperiDONE (RISPERDAL M-TABS) 1 MG disintegrating tablet Take 1 tablet (1 mg total) by mouth daily. Patient not taking: Reported on 03/29/2021 03/31/20   Aldean Baker, NP  risperiDONE (RISPERDAL) 2 MG tablet Take 1 tablet (2 mg total) by mouth at bedtime. Patient not  taking: Reported on 03/29/2021 03/30/20   Aldean Baker, NP      Allergies    Patient has no known allergies.    Review of Systems   Review of Systems  Genitourinary:        STD testing  All other systems reviewed and are negative.   Physical Exam Updated Vital Signs BP 122/76   Pulse 81   Temp 97.7 F (36.5 C) (Oral)   Resp 18   Ht 5\' 9"  (1.753 m)   Wt 63.5 kg   SpO2 100%   BMI 20.67 kg/m   Physical Exam Vitals and nursing note reviewed.  Constitutional:      Appearance: He is well-developed.  HENT:     Head: Normocephalic and atraumatic.  Eyes:     Conjunctiva/sclera: Conjunctivae normal.     Pupils: Pupils are equal, round, and reactive to light.  Cardiovascular:     Rate and Rhythm: Normal rate and regular rhythm.     Heart sounds: Normal heart sounds.  Pulmonary:     Effort: Pulmonary effort is normal. No respiratory distress.     Breath sounds: Normal breath sounds. No rhonchi.  Abdominal:     General: Bowel sounds are normal.     Palpations: Abdomen is soft.  Musculoskeletal:        General: Normal range of motion.     Cervical back: Normal range of motion.  Skin:    General: Skin is warm and dry.  Neurological:  Mental Status: He is alert and oriented to person, place, and time.  Psychiatric:     Comments: Very anxious appearing     ED Results / Procedures / Treatments   Labs (all labs ordered are listed, but only abnormal results are displayed) Labs Reviewed  RAPID HIV SCREEN (HIV 1/2 AB+AG)  RPR  GC/CHLAMYDIA PROBE AMP ([place]) NOT AT [facility]    EKG None  Radiology No results found.  Procedures Procedures    Medications Ordered in ED Medications - No data to display  ED Course/ Medical Decision Making/ A&P                                 Medical Decision Making Amount and/or Complexity of Data Reviewed Labs: ordered. ECG/medicine tests: ordered and independent interpretation performed.   23 year old male presenting  to the ED with concern of STD.  He apparently "finger" a girl yesterday and noticed a small cut on his hands was concerned that he has an STD.  He is completely asymptomatic.    Rapid HIV testing here is negative.  RPR and gc/chl are pending.  Discussed he can follow-up his results on MyChart, will be contacted if these are positive.  Mother also inquired about restarting Prozac for his anxiety which she was on previously.  It looks like this was last prescribed over 3 years ago.  He will need to follow-up with his primary care doctor or [facility] to re-start medications.  Given clinic information.  Can return here for new concerns.  Final Clinical Impression(s) / ED Diagnoses Final diagnoses:  Concern about STD in male without diagnosis    Rx / DC Orders ED Discharge Orders     None         Garlon Hatchet, PA-C 05/16/23 2322    Melene Plan, DO 05/17/23 1511

## 2023-05-16 NOTE — ED Triage Notes (Signed)
 Pt states 2 months ago exposed to STDs. Pt states he is anxious. Pt states last had sex yesterday. Pt states no sx at this time. Pt requesting to be HIV tested as well.

## 2023-05-16 NOTE — Telephone Encounter (Signed)
  Chief Complaint: Advice Only  Additional Notes: Patient calling in with questions on where he can be tested for STI's. Patient states he is unsure of known exposure, but had general questions on how soon to test and where to be tested. This RN offered to schedule an appt in office, patient denies at this time and [facility]. This RN provided education on testing for different types of STI's and explained how the spread of STI's occurs. Also advised that while condoms are effective, there is not a 100% guarantee. Advised patient that while he can be tested today, he will likely not have his results immediately. Patient denies symptoms at this time. Patient advised to call back with further questions or symptoms. Patient verbalized understanding.     Copied from CRM [id number]. Topic: Appointments - Appointment Scheduling >> May 16, 2023 10:38 AM Louie Casa B wrote: Patient/patient representative is calling to schedule an appointment. Refer to attachments for appointment information.  Patient is calling in have medical questions Reason for Disposition  Health Information question, no triage required and triager able to answer question  Answer Assessment - Initial Assessment Questions 1. REASON FOR CALL or QUESTION: "What is your reason for calling today?" or "How can I best help you?" or "What question do you have that I can help answer?"     Patient calling in with questions on where he can be tested for STI's. Patient states he is unsure of known exposure, but had general questions on how soon to test and where to be tested. This RN offered to schedule an appt in office, patient denies at this time and states [facility] few days. This RN provided education on testing for different types of STI's and explained how the spread of STI's occurs. Also advised that while condoms are effective, there is not a 100% guarantee.  Advised patient that while he can be tested today, he will likely not have his results immediately. Patient denies symptoms at this time. Patient advised to call back with further questions or symptoms. Patient verbalized understanding.  Protocols used: Information Only Call - No Triage-A-AH

## 2023-05-16 NOTE — Discharge Instructions (Signed)
 Rapid HIV was negative. Remainder of results should come back in the next 24-48 hours.  You will be contacted if positive, will also upload into mychart. Follow-up with your doctor. You can also follow-up with [facility] about re-starting medications.

## 2023-05-17 LAB — GC/CHLAMYDIA PROBE AMP (~~LOC~~) NOT AT ARMC
Chlamydia: NEGATIVE
Comment: NEGATIVE
Comment: NORMAL
Neisseria Gonorrhea: NEGATIVE

## 2023-05-17 LAB — RPR: RPR Ser Ql: NONREACTIVE

## 2023-06-07 ENCOUNTER — Ambulatory Visit: Payer: Self-pay

## 2023-06-07 ENCOUNTER — Telehealth: Payer: Self-pay

## 2023-06-07 NOTE — Telephone Encounter (Signed)
 Error

## 2023-06-07 NOTE — Telephone Encounter (Signed)
   Chief Complaint: possible HIV exposure Symptoms: none   Disposition: [] ED /[] Urgent Care (no appt availability in office) / [x] Appointment(In office/virtual)/ []  [facility]/ [] Home Care/ [] Refused Recommended Disposition /[]  Mobile Bus/ []  Follow-up with PCP Additional Notes: Tabitha, Pt's mom, calling to set up appt for possible HIV exposure. Pt has no symptoms but has worried himself with any "bump or being  overly tired" that he has HIV. Pt had intercourse on 3/28. Pt recently went to ED for STD testing. ED able to perform all tests except HIV. All were negative.Pt is listed as new pt although he is not.RN called [facility] and office able to schedule him for tomorrow @ 1120.            Reason for Disposition  Patient is worried they have a sexually transmitted infection (STI)  Answer Assessment - Initial Assessment Questions 1. MAIN CONCERN: "What were you exposed to?"  "What sexually transmitted infection (STI) does your sex partner have?" (e.g., gonorrhea, herpes, HIV, pubic lice)     HIV 2. ROUTE of EXPOSURE: "How were you exposed to the STI?" (e.g., oral, vaginal, or rectal intercourse)     Vaginal/oral 3. DATE of EXPOSURE: "When did the exposure occur?" (e.g., days)     3/28 4. SYMPTOMS: "Do you have any symptoms?" (e.g., pain with urination, rash, sores)     tired  Protocols used: STI Exposure-A-AH

## 2023-06-08 ENCOUNTER — Encounter: Payer: Self-pay | Admitting: Nurse Practitioner

## 2023-06-08 ENCOUNTER — Ambulatory Visit (INDEPENDENT_AMBULATORY_CARE_PROVIDER_SITE_OTHER): Payer: Self-pay | Admitting: Nurse Practitioner

## 2023-06-08 VITALS — BP 105/63 | HR 63 | Temp 98.1°F | Ht 69.0 in | Wt 146.8 lb

## 2023-06-08 DIAGNOSIS — Z114 Encounter for screening for human immunodeficiency virus [HIV]: Secondary | ICD-10-CM

## 2023-06-08 DIAGNOSIS — R21 Rash and other nonspecific skin eruption: Secondary | ICD-10-CM

## 2023-06-08 MED ORDER — TRIAMCINOLONE ACETONIDE 0.1 % EX CREA: 1.0000 | TOPICAL_CREAM | Freq: Two times a day (BID) | CUTANEOUS | 0 refills | Status: DC

## 2023-06-08 NOTE — Progress Notes (Signed)
 Subjective:    Patient ID: Alejandro Wood, male    DOB: [birth year], 23 y.o.   MRN: [id number]  HPI Presents for recheck after ED visit and to discuss recheck of HIV status.  Has had only 1 male partner in the past year.  Denies sex with males.  States he uses condoms each time.  Was worried about STDs, specifically HIV after sexual encounter on 05/15/2023.  Has a complex psych history, has discontinued all of his medications.  According to patient he has been diagnosed with bipolar disorder and schizophrenia.  Stopped the medicines because he did not like how they made him feel and felt that his mental health provider was not listening to him about the side effects.  Denies suicidal or homicidal thoughts or ideation at this time.  Does continue to struggle greatly with depression and anxiety.  Denies tobacco, alcohol, drug or marijuana use.  Has a full-time job.  Has good family support.  In addition patient has a slight rash in the antecubital area of both arms.  The 1 on the left is a faint rash from the adhesive from the tape that he used in the emergency room.  The right one is a few bumps that he noticed after being around a dog.  Minimally pruritic.  Nontender.  Has also noted a few bumps on his chest.   Review of Systems  Constitutional:  Positive for fatigue. Negative for fever.  Respiratory:  Negative for cough, chest tightness, shortness of breath and wheezing.   Cardiovascular:  Negative for chest pain and palpitations.  Gastrointestinal:  Positive for nausea. Negative for abdominal pain, constipation, diarrhea and vomiting.  Skin:  Positive for rash.   Social History   Tobacco Use   Smoking status: Never    Passive exposure: Never   Smokeless tobacco: Never  Vaping Use   Vaping status: Former  Substance Use Topics   Alcohol use: No   Drug use: No      06/08/2023    1:06 PM  Depression screen PHQ 2/9  Decreased Interest 3  Down, Depressed, Hopeless 3  PHQ - 2 Score 6   Altered sleeping 3  Tired, decreased energy 3  Change in appetite 3  Feeling bad or failure about yourself  3  Trouble concentrating 2  Moving slowly or fidgety/restless 3  Suicidal thoughts 0  PHQ-9 Score 23  Difficult doing work/chores Extremely dIfficult      06/08/2023    1:06 PM  GAD 7 : Generalized Anxiety Score  Nervous, Anxious, on Edge 3  Control/stop worrying 3  Worry too much - different things 3  Trouble relaxing 3  Restless 3  Easily annoyed or irritable 2  Afraid - awful might happen 1  Total GAD 7 Score 18  Anxiety Difficulty Extremely difficult           Objective:   Physical Exam NAD.  Alert, oriented.  Mildly anxious affect.  Making good eye contact.  Became slightly tearful when talking about his depression.  Speech clear.  Dressed appropriately for the weather.  Normal judgment and behavior.  Lungs clear.  Heart regular rate rhythm.  A couple of discrete pink papules noted on the antecubital area of both arms, on the left side there is still a faint outline of a bandage.  A few scattered discrete pink papules noted on the chest.  No pustular lesions noted. Today's Vitals   06/08/23 1027  BP: 105/63  Pulse: 63  Temp: 98.1 F (36.7 C)  SpO2: 100%  Weight: 146 lb 12.8 oz (66.6 kg)  Height: 5\' 9"  (1.753 m)   Body mass index is 21.68 kg/m. See labs dated 05/16/2023 and 06/03/2023: RPR Chlamydia and HIV all negative.       Assessment & Plan:  Encounter for screening for HIV - Plan: HIV antibody (with reflex)  Rash and nonspecific skin eruption Meds ordered this encounter  Medications   triamcinolone  cream (KENALOG ) 0.1 %    Sig: Apply 1 Application topically 2 (two) times daily. Prn rash; use up to 2 weeks    Dispense:  30 g    Refill:  0    Supervising Provider:   Charlotta Cook A [[id number]]   Repeat HIV screening early July which is 3 months after the initial testing.  Based on his sexual history, do not feel he is a candidate for PrEP at this  time. Discussed safe sex issues. Encourage patient to restart mental health care including medication management and therapy.  Since he has now started a new job and has insurance, recommend he check through his insurance to see what services are available at reduced or no cost.  He agrees with this plan.  Patient also verbally agrees to seek help immediately if any thoughts of hurting himself or anyone else. Recheck here as needed.

## 2023-08-17 ENCOUNTER — Ambulatory Visit: Payer: Self-pay | Admitting: Nurse Practitioner

## 2024-03-01 ENCOUNTER — Ambulatory Visit: Payer: Self-pay | Admitting: Nurse Practitioner

## 2024-03-01 VITALS — BP 113/69 | HR 94 | Ht 69.0 in | Wt 158.4 lb

## 2024-03-01 DIAGNOSIS — F6381 Intermittent explosive disorder: Secondary | ICD-10-CM | POA: Diagnosis not present

## 2024-03-01 DIAGNOSIS — K5909 Other constipation: Secondary | ICD-10-CM | POA: Diagnosis not present

## 2024-03-01 DIAGNOSIS — L748 Other eccrine sweat disorders: Secondary | ICD-10-CM

## 2024-03-01 DIAGNOSIS — B353 Tinea pedis: Secondary | ICD-10-CM

## 2024-03-01 DIAGNOSIS — F422 Mixed obsessional thoughts and acts: Secondary | ICD-10-CM

## 2024-03-01 MED ORDER — LINACLOTIDE 145 MCG PO CAPS: 145.0000 ug | ORAL_CAPSULE | Freq: Every day | ORAL | 2 refills | Status: AC

## 2024-03-01 MED ORDER — KETOCONAZOLE 2 % EX CREA: 1.0000 | TOPICAL_CREAM | Freq: Two times a day (BID) | CUTANEOUS | 0 refills | Status: AC

## 2024-03-01 NOTE — Progress Notes (Unsigned)
 "  Subjective:    Patient ID: Alejandro Wood, male    DOB: Mar 23, 2000, 24 y.o.   MRN: [id number]  HPI Patient is in room 10  Patient is here for wanting to discuss ADHD medication. Patient stated that he has social anxiety, always feels like someone is after him about something, and letting his emotions get to him. Patient stated that he will come off aggressive if he is being targeted by someone or something. Does not like crowded areas with people  Patient also stated he has a bad scent and sweating on both of his feet. It has been going on since elementary school but has put it off. Patient stated that his pinky area on the sides of the feet are irritated and red. Patient stated that smell is still present even after a shower    Denies suicidal or homicidal ideation. Has trouble controlling his anger at times, particularly with one cousin. 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 For cultural reasons, he has a hard time explaining his mental health challenges to his family. Does have support from his sister. Denies any drug or substance use.    Requests refills on Linzess  for chronic constipation which is working well.   Review of Systems  Respiratory:  Negative for chest tightness and shortness of breath.   Cardiovascular:  Negative for chest pain.  Skin:  Positive for rash.  Psychiatric/Behavioral:  Positive for agitation, decreased concentration, dysphoric mood and sleep disturbance. Negative for suicidal ideas. The patient is nervous/anxious.        03/01/2024    3:16 PM  Depression screen PHQ 2/9  Decreased Interest 2  Down, Depressed, Hopeless 0  PHQ - 2 Score 2  Altered sleeping 3  Tired, decreased energy 1  Change in appetite 2  Feeling bad or failure about yourself  0  Trouble concentrating 0  Moving slowly or fidgety/restless 3  Suicidal thoughts 0  PHQ-9 Score 11  Difficult doing work/chores Very difficult      03/01/2024    3:16 PM 06/08/2023    1:06 PM  GAD 7 : Generalized  Anxiety Score  Nervous, Anxious, on Edge 3  3   Control/stop worrying 2  3   Worry too much - different things 3  3   Trouble relaxing 3  3   Restless 3  3   Easily annoyed or irritable 3  2   Afraid - awful might happen 3  1   Total GAD 7 Score 20 18  Anxiety Difficulty Extremely difficult Extremely difficult     Data saved with a previous flowsheet row definition    Social History[1]     Objective:   Physical Exam Vitals and nursing note reviewed.  Constitutional:      General: He is not in acute distress.    Appearance: Normal appearance.  Cardiovascular:     Rate and Rhythm: Normal rate and regular rhythm.  Pulmonary:     Effort: Pulmonary effort is normal.     Breath sounds: Normal breath sounds.  Abdominal:     General: There is no distension.     Palpations: Abdomen is soft. There is no mass.     Tenderness: There is no abdominal tenderness. There is no guarding or rebound.  Skin:    Comments: Moistened white peeling section noted on the foot with a few areas of erythema. No signs of a bacterial infection. Several calluses noted.   Neurological:     Mental Status: He  is alert and oriented to person, place, and time.  Psychiatric:        Attention and Perception: Attention and perception normal.        Mood and Affect: Mood and affect normal.        Speech: Speech normal.        Behavior: Behavior normal. Behavior is cooperative.        Thought Content: Thought content normal.        Cognition and Memory: Cognition normal.        Judgment: Judgment is not impulsive.   Making good eye contact.  Dressed appropriately for the weather.  Today's Vitals   03/01/24 1506  BP: 113/69  Pulse: 94  SpO2: 95%  Weight: 158 lb 6 oz (71.8 kg)  Height: 5' 9 (1.753 m)   Body mass index is 23.39 kg/m.      Assessment & Plan:  1. Mixed obsessional thoughts and acts (Primary) Due to complex mental health history, will refer for medication management and counseling.   Emotional dysregulation with anger and sadness linked to family and work stress. No suicidal ideation. Cultural factors hinder help-seeking. Open to telehealth counseling. - Referred to mental health provider for telehealth counseling. - Advised to limit contact with exacerbating family members. - Encouraged seeking help if symptoms worsen. - Ambulatory referral to Psychiatry  2. Intermittent explosive disorder in adult Emotional dysregulation with anger and sadness linked to family and work stress. No suicidal ideation. Cultural factors hinder help-seeking. Open to telehealth counseling. - Referred to mental health provider for telehealth counseling. - Advised to limit contact with exacerbating family members. - Encouraged seeking help if symptoms worsen. - Ambulatory referral to Psychiatry  3. Tinea pedis of left foot - Prescribed ketoconazole  cream. - Recommended pumice stone for calluses. - Advised antifungal sprays and shoe fresheners. - ketoconazole  (NIZORAL ) 2 % cream; Apply 1 Application topically 2 (two) times daily. To feet prn  Dispense: 30 g; Refill: 0  4. Foot odor - Prescribed ketoconazole  cream. - Recommended pumice stone for calluses. - Advised antifungal sprays and shoe fresheners. - Remove work shoes and wet socks each day for feet to air dry.   5. Chronic constipation Continue Linzess  as directed.  - linaclotide  (LINZESS ) 145 MCG CAPS capsule; Take 1 capsule (145 mcg total) by mouth daily before breakfast. For constipation  Dispense: 30 capsule; Refill: 2  Recheck here as needed.        [1]  Social History Tobacco Use   Smoking status: Never    Passive exposure: Never   Smokeless tobacco: Never  Vaping Use   Vaping status: Former  Substance Use Topics   Alcohol use: No   Drug use: No   "

## 2024-03-04 ENCOUNTER — Encounter: Payer: Self-pay | Admitting: Nurse Practitioner

## 2024-03-04 DIAGNOSIS — K5909 Other constipation: Secondary | ICD-10-CM | POA: Insufficient documentation

## 2024-03-05 ENCOUNTER — Encounter: Payer: Self-pay | Admitting: Nurse Practitioner

## 2024-03-07 ENCOUNTER — Other Ambulatory Visit: Payer: Self-pay | Admitting: Nurse Practitioner

## 2024-03-07 DIAGNOSIS — F422 Mixed obsessional thoughts and acts: Secondary | ICD-10-CM

## 2024-03-07 DIAGNOSIS — F6381 Intermittent explosive disorder: Secondary | ICD-10-CM

## 2024-03-18 IMAGING — DX DG WRIST COMPLETE 3+V*R*
4 series · 4 of 4 positions shown · non-contrast
Comparison: None Available.

CLINICAL DATA: Punched a refrigerator.  Right wrist pain.

EXAM:
RIGHT WRIST - COMPLETE 3+ VIEW

[wrist ap]
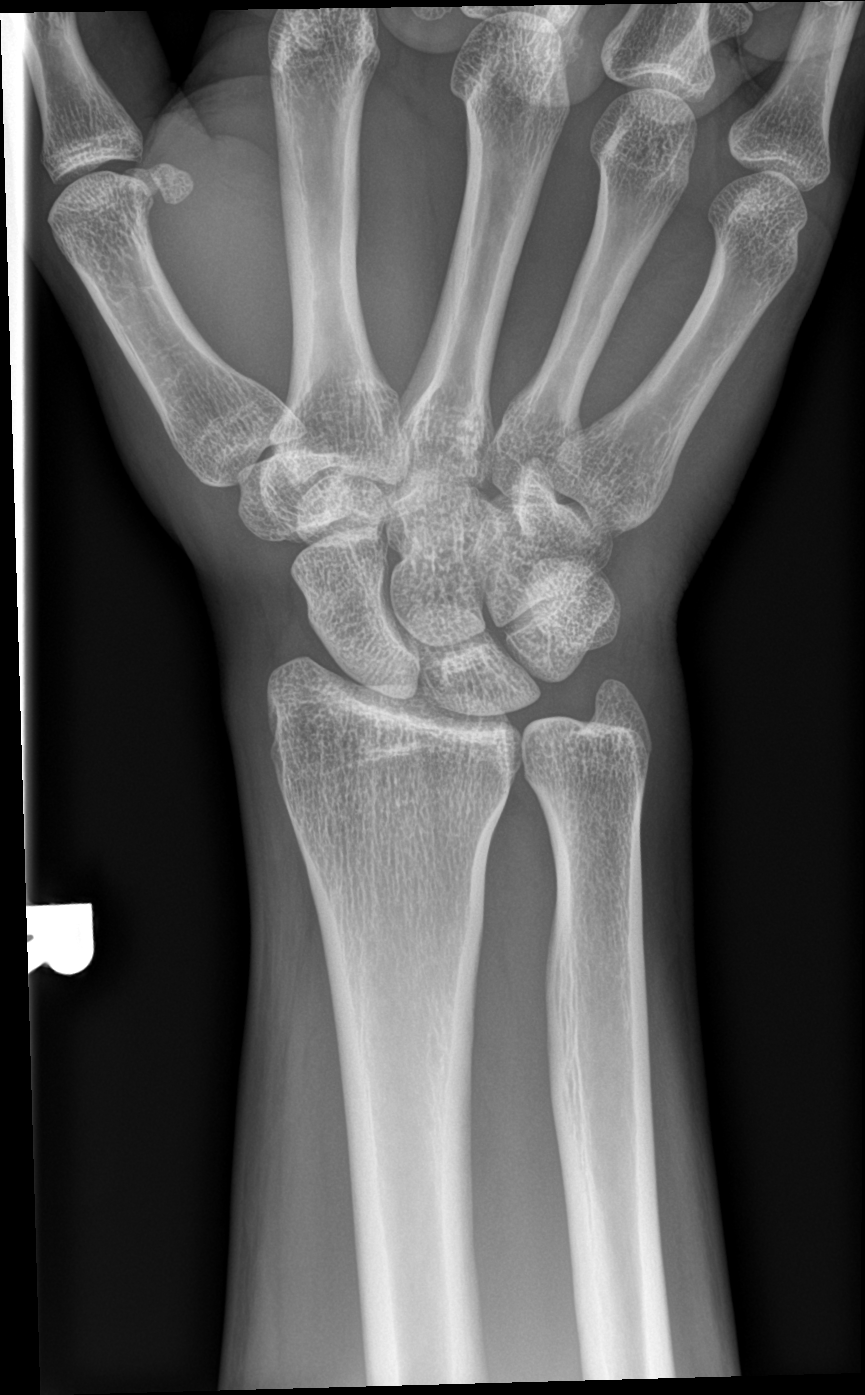

[wrist obl]
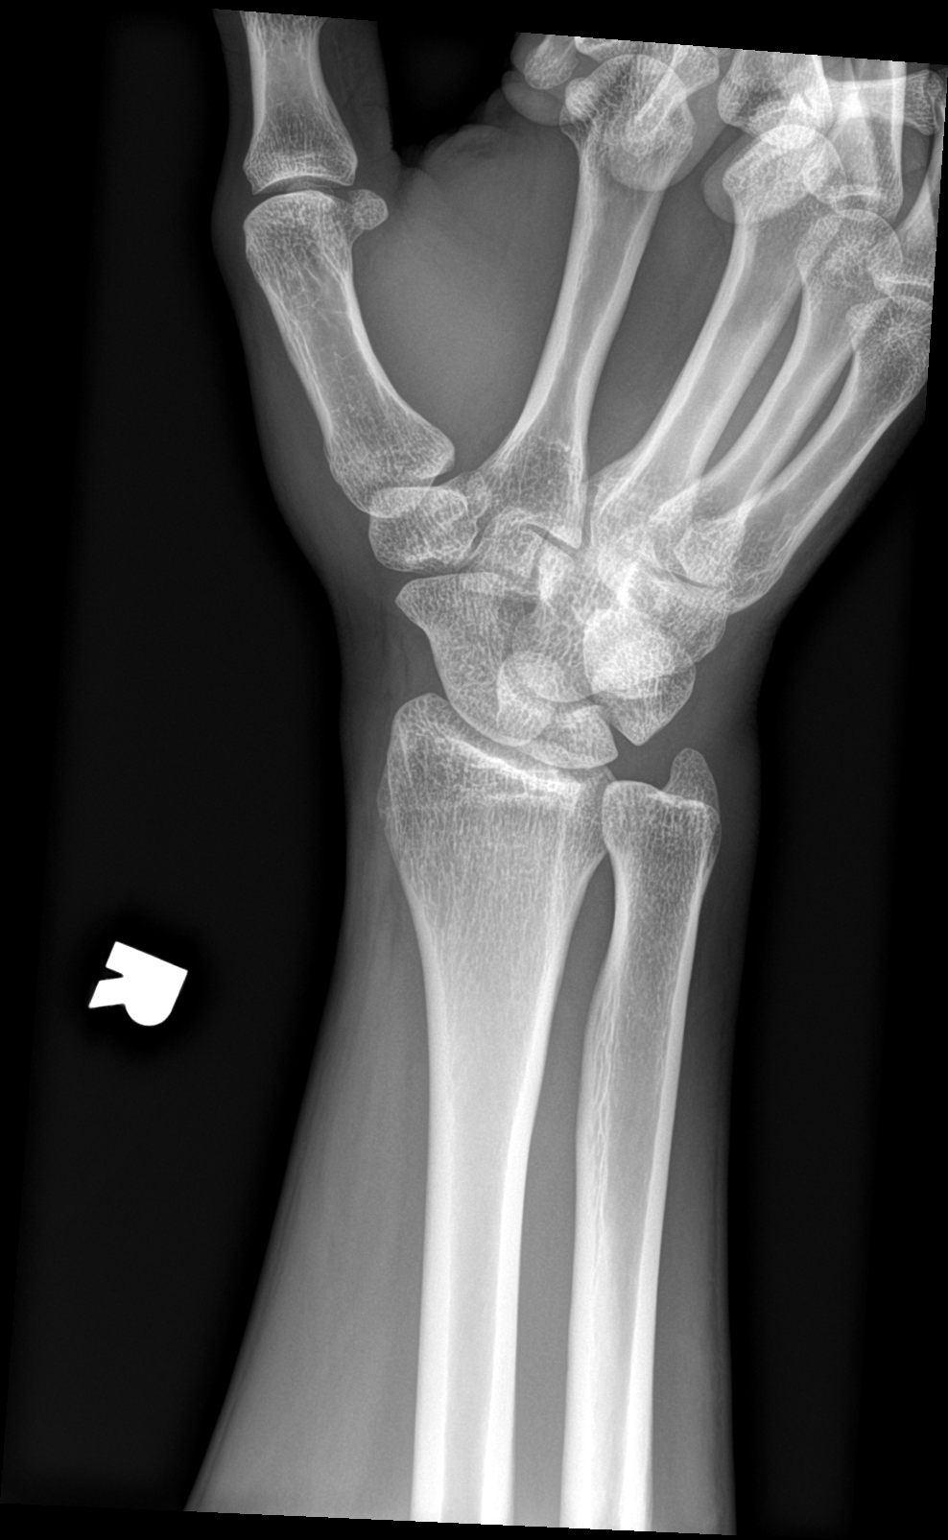

[wrist lat]
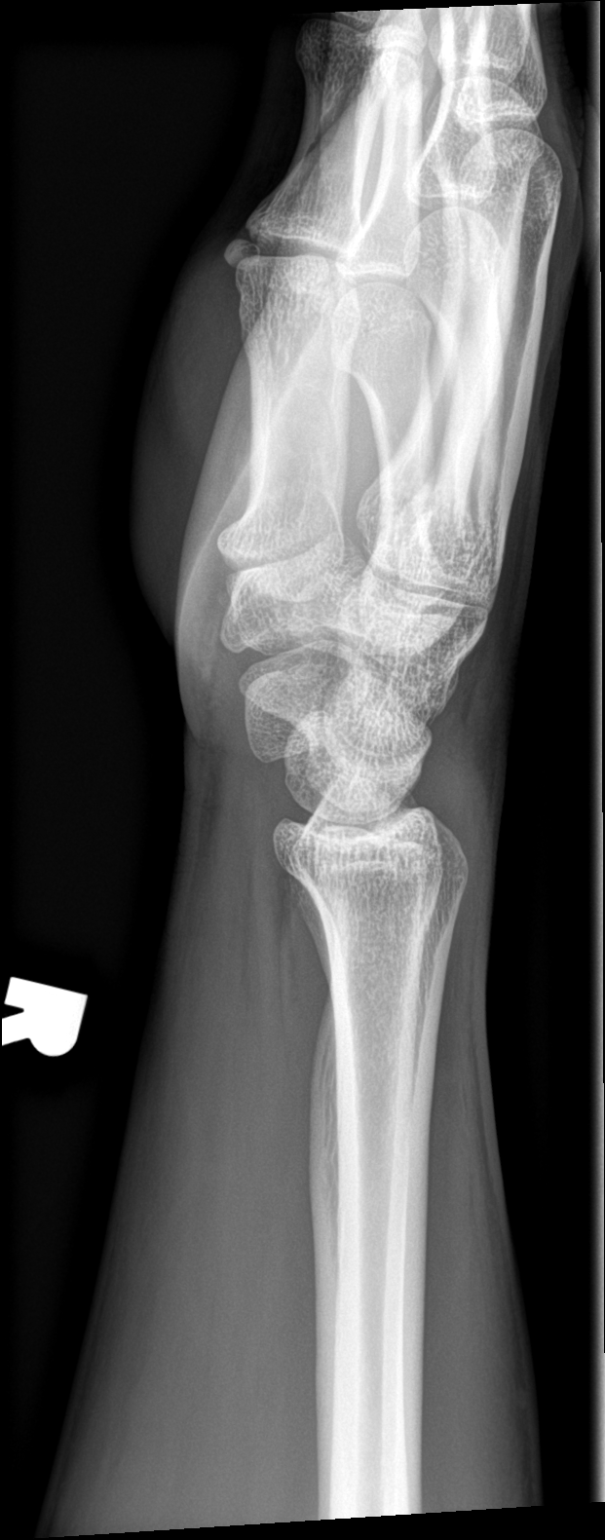

[wrist navicular]
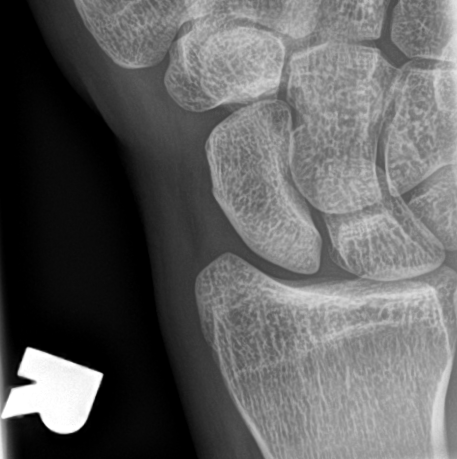

[4 of 4 positions shown; findings below may reference images not displayed]

FINDINGS: There is no evidence of fracture or dislocation. There is no
evidence of arthropathy or other focal bone abnormality. Soft
tissues are unremarkable.
IMPRESSION: Negative.
